# Patient Record
Sex: Female | Born: 1975 | Race: White | Hispanic: No | Marital: Married | Smoking: Never smoker
Health system: Southern US, Community
[De-identification: ages and names within clinical notes are randomized; demographics above are authoritative.]

## PROBLEM LIST (undated history)

## (undated) DIAGNOSIS — Q66219 Congenital metatarsus primus varus, unspecified foot: Secondary | ICD-10-CM

## (undated) DIAGNOSIS — M1990 Unspecified osteoarthritis, unspecified site: Secondary | ICD-10-CM

## (undated) DIAGNOSIS — L409 Psoriasis, unspecified: Secondary | ICD-10-CM

## (undated) DIAGNOSIS — M2040 Other hammer toe(s) (acquired), unspecified foot: Secondary | ICD-10-CM

## (undated) DIAGNOSIS — M2010 Hallux valgus (acquired), unspecified foot: Secondary | ICD-10-CM

## (undated) DIAGNOSIS — N189 Chronic kidney disease, unspecified: Secondary | ICD-10-CM

## (undated) DIAGNOSIS — R7401 Elevation of levels of liver transaminase levels: Secondary | ICD-10-CM

## (undated) DIAGNOSIS — E063 Autoimmune thyroiditis: Secondary | ICD-10-CM

## (undated) DIAGNOSIS — R740 Nonspecific elevation of levels of transaminase and lactic acid dehydrogenase [LDH]: Secondary | ICD-10-CM

## (undated) DIAGNOSIS — E039 Hypothyroidism, unspecified: Secondary | ICD-10-CM

## (undated) DIAGNOSIS — N200 Calculus of kidney: Secondary | ICD-10-CM

## (undated) DIAGNOSIS — F32A Depression, unspecified: Secondary | ICD-10-CM

## (undated) DIAGNOSIS — F329 Major depressive disorder, single episode, unspecified: Secondary | ICD-10-CM

## (undated) HISTORY — DX: Elevation of levels of liver transaminase levels: R74.01

## (undated) HISTORY — DX: Psoriasis, unspecified: L40.9

## (undated) HISTORY — DX: Autoimmune thyroiditis: E06.3

## (undated) HISTORY — DX: Chronic kidney disease, unspecified: N18.9

## (undated) HISTORY — DX: Calculus of kidney: N20.0

## (undated) HISTORY — DX: Nonspecific elevation of levels of transaminase and lactic acid dehydrogenase (ldh): R74.0

## (undated) HISTORY — PX: FOOT SURGERY: SHX648

## (undated) HISTORY — DX: Unspecified osteoarthritis, unspecified site: M19.90

---

## 2011-06-01 ENCOUNTER — Other Ambulatory Visit (HOSPITAL_BASED_OUTPATIENT_CLINIC_OR_DEPARTMENT_OTHER): Payer: Self-pay | Admitting: Family Medicine

## 2011-06-01 DIAGNOSIS — Z1231 Encounter for screening mammogram for malignant neoplasm of breast: Secondary | ICD-10-CM

## 2011-06-02 ENCOUNTER — Ambulatory Visit (HOSPITAL_BASED_OUTPATIENT_CLINIC_OR_DEPARTMENT_OTHER)
Admission: RE | Admit: 2011-06-02 | Discharge: 2011-06-02 | Disposition: A | Discharge status: Home / Self Care | Payer: 59 | Source: Ambulatory Visit | Attending: Family Medicine | Admitting: Family Medicine

## 2011-06-02 ENCOUNTER — Ambulatory Visit: Payer: Self-pay | Admitting: Internal Medicine

## 2011-06-02 DIAGNOSIS — Z1231 Encounter for screening mammogram for malignant neoplasm of breast: Secondary | ICD-10-CM

## 2011-06-29 DIAGNOSIS — M2040 Other hammer toe(s) (acquired), unspecified foot: Secondary | ICD-10-CM

## 2011-06-29 DIAGNOSIS — Q66219 Congenital metatarsus primus varus, unspecified foot: Secondary | ICD-10-CM

## 2011-06-29 DIAGNOSIS — M2010 Hallux valgus (acquired), unspecified foot: Secondary | ICD-10-CM

## 2011-06-29 HISTORY — PX: BUNIONECTOMY: SHX129

## 2011-06-29 HISTORY — DX: Congenital metatarsus primus varus, unspecified foot: Q66.219

## 2011-06-29 HISTORY — PX: BREAST ENHANCEMENT SURGERY: SHX7

## 2011-06-29 HISTORY — DX: Other hammer toe(s) (acquired), unspecified foot: M20.40

## 2011-06-29 HISTORY — DX: Hallux valgus (acquired), unspecified foot: M20.10

## 2011-07-02 ENCOUNTER — Ambulatory Visit: Payer: Self-pay | Admitting: Family Medicine

## 2011-07-06 ENCOUNTER — Ambulatory Visit (INDEPENDENT_AMBULATORY_CARE_PROVIDER_SITE_OTHER): Payer: 59

## 2011-07-06 DIAGNOSIS — M79609 Pain in unspecified limb: Secondary | ICD-10-CM

## 2011-07-08 ENCOUNTER — Encounter (HOSPITAL_BASED_OUTPATIENT_CLINIC_OR_DEPARTMENT_OTHER): Payer: Self-pay | Admitting: *Deleted

## 2011-07-08 NOTE — Pre-Procedure Instructions (Signed)
H & P req. from Dr. Beverlee Nims office

## 2011-07-14 ENCOUNTER — Encounter (HOSPITAL_BASED_OUTPATIENT_CLINIC_OR_DEPARTMENT_OTHER): Admission: RE | Payer: Self-pay | Source: Ambulatory Visit

## 2011-07-14 ENCOUNTER — Ambulatory Visit (HOSPITAL_BASED_OUTPATIENT_CLINIC_OR_DEPARTMENT_OTHER): Admit: 2011-07-14 | Payer: Self-pay | Admitting: Podiatry

## 2011-07-14 ENCOUNTER — Encounter (HOSPITAL_BASED_OUTPATIENT_CLINIC_OR_DEPARTMENT_OTHER): Payer: Self-pay

## 2011-07-14 ENCOUNTER — Ambulatory Visit (HOSPITAL_BASED_OUTPATIENT_CLINIC_OR_DEPARTMENT_OTHER): Admission: RE | Admit: 2011-07-14 | Payer: 59 | Source: Ambulatory Visit | Admitting: Podiatry

## 2011-07-14 HISTORY — DX: Hypothyroidism, unspecified: E03.9

## 2011-07-14 HISTORY — DX: Depression, unspecified: F32.A

## 2011-07-14 HISTORY — DX: Major depressive disorder, single episode, unspecified: F32.9

## 2011-07-14 HISTORY — DX: Congenital metatarsus primus varus, unspecified foot: Q66.219

## 2011-07-14 HISTORY — DX: Hallux valgus (acquired), unspecified foot: M20.10

## 2011-07-14 HISTORY — DX: Other hammer toe(s) (acquired), unspecified foot: M20.40

## 2011-07-14 SURGERY — BUNIONECTOMY
Anesthesia: Monitor Anesthesia Care | Laterality: Right

## 2011-08-10 ENCOUNTER — Ambulatory Visit (INDEPENDENT_AMBULATORY_CARE_PROVIDER_SITE_OTHER): Payer: 59 | Admitting: Psychology

## 2011-08-10 DIAGNOSIS — F341 Dysthymic disorder: Secondary | ICD-10-CM

## 2011-08-10 DIAGNOSIS — Z630 Problems in relationship with spouse or partner: Secondary | ICD-10-CM

## 2011-08-10 NOTE — Progress Notes (Signed)
Patient ID: Lacey Murphy, female   DOB: [birth year], 35 y.o.   MRN: [id number]   Lacey Murphy made this appointment to explore the possibility of marital counseling with her and her husband.  She revealed that some secrets have come out since Christmas that have created a crisis that they are working through together and feel they may need some help.    They have already seen one counselor and liked her very much, but she is out-of-network, so she is exploring other options.  I reviewed her self-report, talked with her about her current treatment for dysthymia and her understanding of what she and her husband want to be different.  She reports that the stress in their lives is much less than in the past, the children are both in school, and she wants to strengthen the good things they have now.  She says that actually things are much improved than in Dec. And she hopes to maintain the gains they have made.  We did not go into lengthy history, but instead did some problem-solving about how she might see one of our marriage therapists at the [place] office, or see the person they have already seen and apply for reimbursement from insurance.  Lacey Murphy chose to not make another appointment here, but will follow one of the other suggestions on her own.      Shonna Chock, APRN

## 2011-08-18 ENCOUNTER — Encounter: Payer: 59 | Admitting: Internal Medicine

## 2011-09-02 ENCOUNTER — Ambulatory Visit (INDEPENDENT_AMBULATORY_CARE_PROVIDER_SITE_OTHER): Payer: 59 | Admitting: Internal Medicine

## 2011-09-02 ENCOUNTER — Encounter: Payer: Self-pay | Admitting: Internal Medicine

## 2011-09-02 VITALS — BP 110/60 | HR 83 | Temp 99.1°F | Ht 61.25 in | Wt 115.0 lb

## 2011-09-02 DIAGNOSIS — N760 Acute vaginitis: Secondary | ICD-10-CM

## 2011-09-02 DIAGNOSIS — Z803 Family history of malignant neoplasm of breast: Secondary | ICD-10-CM

## 2011-09-02 DIAGNOSIS — Z1272 Encounter for screening for malignant neoplasm of vagina: Secondary | ICD-10-CM

## 2011-09-02 DIAGNOSIS — E039 Hypothyroidism, unspecified: Secondary | ICD-10-CM

## 2011-09-02 DIAGNOSIS — G43909 Migraine, unspecified, not intractable, without status migrainosus: Secondary | ICD-10-CM | POA: Insufficient documentation

## 2011-09-02 DIAGNOSIS — F329 Major depressive disorder, single episode, unspecified: Secondary | ICD-10-CM | POA: Insufficient documentation

## 2011-09-02 DIAGNOSIS — Z01419 Encounter for gynecological examination (general) (routine) without abnormal findings: Secondary | ICD-10-CM

## 2011-09-02 DIAGNOSIS — Z113 Encounter for screening for infections with a predominantly sexual mode of transmission: Secondary | ICD-10-CM

## 2011-09-02 DIAGNOSIS — Z800 Family history of malignant neoplasm of digestive organs: Secondary | ICD-10-CM | POA: Insufficient documentation

## 2011-09-02 LAB — CBC WITH DIFFERENTIAL/PLATELET
Basophils Absolute: 0 10*3/uL (ref 0.0–0.1)
Eosinophils Relative: 1 % (ref 0–5)
Lymphocytes Relative: 18 % (ref 12–46)
Lymphs Abs: 1.5 10*3/uL (ref 0.7–4.0)
Neutro Abs: 6.4 10*3/uL (ref 1.7–7.7)
Neutrophils Relative %: 77 % (ref 43–77)
Platelets: 267 10*3/uL (ref 150–400)
RBC: 4.74 MIL/uL (ref 3.87–5.11)
RDW: 12.5 % (ref 11.5–15.5)
WBC: 8.4 10*3/uL (ref 4.0–10.5)

## 2011-09-02 LAB — LIPID PANEL
Cholesterol: 183 mg/dL (ref 0–200)
Total CHOL/HDL Ratio: 2.8 Ratio
VLDL: 28 mg/dL (ref 0–40)

## 2011-09-02 LAB — T3, FREE: T3, Free: 2.4 pg/mL (ref 2.3–4.2)

## 2011-09-02 LAB — COMPREHENSIVE METABOLIC PANEL
ALT: 37 U/L — ABNORMAL HIGH (ref 0–35)
AST: 31 U/L (ref 0–37)
CO2: 26 mEq/L (ref 19–32)
Calcium: 9.3 mg/dL (ref 8.4–10.5)
Chloride: 102 mEq/L (ref 96–112)
Creat: 0.97 mg/dL (ref 0.50–1.10)
Sodium: 140 mEq/L (ref 135–145)
Total Protein: 6.6 g/dL (ref 6.0–8.3)

## 2011-09-02 LAB — POCT URINALYSIS DIPSTICK
Blood, UA: NEGATIVE
Glucose, UA: NEGATIVE
Ketones, UA: NEGATIVE
Spec Grav, UA: 1.015
Urobilinogen, UA: NEGATIVE

## 2011-09-02 LAB — TSH: TSH: 2.848 u[IU]/mL (ref 0.350–4.500)

## 2011-09-02 LAB — T4, FREE: Free T4: 1.25 ng/dL (ref 0.80–1.80)

## 2011-09-02 MED ORDER — FLUCONAZOLE 150 MG PO TABS: 150.0000 mg | ORAL_TABLET | Freq: Once | ORAL | Status: AC

## 2011-09-02 MED ORDER — LEVOTHYROXINE SODIUM 75 MCG PO TABS: 75.0000 ug | ORAL_TABLET | Freq: Every day | ORAL | Status: DC

## 2011-09-02 NOTE — Progress Notes (Signed)
Subjective:    Patient ID: Lacey Murphy, female    DOB: 09-06-1975, 36 y.o.   MRN: [id number]  HPI Itzelle is here for a new pt visit and CPE.  Former primary Dr. Gertie Gowda.  PMH of migraine headaches,  Depression and mild dysthymia per her report "for years", hypothyroidism and recent R bunionectomy 1/13 and breast augmentation 1/13.    No Known Allergies Past Medical History  Diagnosis Date  . 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 Hammertoe 06/2011    right 4th  . Migraine   . Depression   . Hypothyroidism    Past Surgical History  Procedure Date  . Foot surgery     bilat.  . Breast enhancement surgery 1/13  . Bunionectomy 1/13    right   History   Social History  . Marital Status: Married    Spouse Name: N/A    Number of Children: N/A  . Years of Education: N/A   Occupational History  . Not on file.   Social History Main Topics  . Smoking status: Never Smoker   . Smokeless tobacco: Never Used  . Alcohol Use: 4.2 oz/week    7 Glasses of wine per week     socially  . Drug Use: No  . Sexually Active: Yes    Birth Control/ Protection: Surgical   Other Topics Concern  . Not on file   Social History Narrative  . No narrative on file   Family History  Problem Relation Age of Onset  . Breast cancer Mother   . Hyperlipidemia Mother   . Colon polyps Mother   . Heart failure Father   . Prostate cancer Father   . Depression Sister   . Depression Brother   . Colon cancer Maternal Grandmother   . Colon polyps Maternal Grandmother   . Hypertension Paternal Grandmother   . Heart failure Paternal Grandfather    Patient Active Problem List  Diagnoses  . Depression  . Migraine  . Hypothyroidism  . Hammertoe   Current Outpatient Prescriptions on File Prior to Visit  Medication Sig Dispense Refill  . folic acid (FOLVITE) 1 MG tablet Take 15 mg by mouth daily.           Review of Systems     Objective:   Physical Exam Physical Exam  Vital signs and nursing note reviewed  Constitutional: She is oriented to person, place, and time. She appears well-developed and well-nourished. She is cooperative.  HENT:  Head: Normocephalic and atraumatic.  Right Ear: Tympanic membrane normal.  Left Ear: Tympanic membrane normal.  Nose: Nose normal.  Mouth/Throat: Oropharynx is clear and moist and mucous membranes are normal. No oropharyngeal exudate or posterior oropharyngeal erythema.  Eyes: Conjunctivae and EOM are normal. Pupils are equal, round, and reactive to light.  Neck: Neck supple. No JVD present. Carotid bruit is not present. No mass and no thyromegaly present.  Cardiovascular: Regular rhythm, normal heart sounds, intact distal pulses and normal pulses.  Exam reveals no gallop and no friction rub.   No murmur heard. Pulses:      Dorsalis pedis pulses are 2+ on the right side, and 2+ on the left side.  Pulmonary/Chest: Breath sounds normal. She has no wheezes. She has no rhonchi. She has no rales. Right breast exhibits no mass, no nipple discharge and no skin change. Left breast exhibits no mass, no nipple discharge and no skin  change.  Abdominal: Soft. Bowel sounds are normal. She exhibits no distension and no mass. There is no hepatosplenomegaly. There is no tenderness. There is no CVA tenderness.  Genitourinary: Rectum normal, vagina normal and uterus normal. Rectal exam shows no mass. Guaiac negative stool. No labial fusion. There is no lesion on the right labia. There is no lesion on the left labia. Cervix exhibits no motion tenderness. Right adnexum displays no mass, no tenderness and no fullness. Left adnexum displays no mass, no tenderness and no fullness. No erythema around the vagina.  Musculoskeletal:       No active synovitis to any joint.    Lymphadenopathy:       Right cervical: No superficial cervical adenopathy present.      Left cervical: No superficial cervical adenopathy present.         Right axillary: No pectoral and no lateral adenopathy present.       Left axillary: No pectoral and no lateral adenopathy present.      Right: No inguinal adenopathy present.       Left: No inguinal adenopathy present.  Neurological: She is alert and oriented to person, place, and time. She has normal strength and normal reflexes. No cranial nerve deficit or sensory deficit. She displays a negative Romberg sign. Coordination and gait normal.  Skin: Skin is warm and dry. No abrasion, no bruising, no ecchymosis and no rash noted. No cyanosis. Nails show no clubbing.  Psychiatric: She has a normal mood and affect. Her speech is normal and behavior is normal.          Assessment & Plan:  1)  Elevated HGB on fingerstick. Will recheck today 2)  Health Maintenance:  See scanned sheet UTD on TDAP  2)  Vaginitis  OK for 5 day coruse of diflucan 3)  FH of breast CA nd colon CA  Will get BRCA today.  Pt is to check with mother as to when [facility] diagnosed with colon CA 4)  Depression/Dysthymia  I asked pt to get records from   Psychiatry office 5)  Hypothyroidism:  Check levels today  Will need old records        Assessment & Plan:

## 2011-09-02 NOTE — Patient Instructions (Addendum)
Labs will be mailed to you  See me as needed 

## 2011-09-03 LAB — WET PREP, GENITAL
Clue Cells Wet Prep HPF POC: NONE SEEN
Trich, Wet Prep: NONE SEEN
WBC, Wet Prep HPF POC: NONE SEEN
Yeast Wet Prep HPF POC: NONE SEEN

## 2011-09-06 ENCOUNTER — Encounter: Payer: Self-pay | Admitting: Emergency Medicine

## 2011-09-13 ENCOUNTER — Encounter: Payer: Self-pay | Admitting: Emergency Medicine

## 2011-09-15 ENCOUNTER — Encounter: Payer: Self-pay | Admitting: Internal Medicine

## 2011-09-16 ENCOUNTER — Telehealth: Payer: Self-pay | Admitting: Internal Medicine

## 2011-09-16 NOTE — Telephone Encounter (Signed)
Spoke with Lacey Murphy, she is aware BRACA was negative.    She also has a question regarding her regular lab work.  She states that she noted her TSH was 2.8.  It was 2.0 in October per her report.  She reports that she generally feels better when her TSH is in the 1.8-2 range and wondered if DDS would be willing to increase her Synthroid to ?  Aware I will speak with DDS and call back with her recommendations.

## 2011-09-16 NOTE — Telephone Encounter (Signed)
Pt is calling to see if the results of her test for history of breast cancer came to you. She states she spoke with the company and they stated she should contact the doctor's office. Please call pt at [phone]. Thanks

## 2011-09-19 NOTE — Telephone Encounter (Signed)
Tell Lacey Murphy to change dose to 1 tab M-F and 1 and 1/2 tab of the 75 mcg on Sat and Sun and this should get her TSH in normal range  See needs to come to office in 8-10 weeks for a TSH  Blood draw.    Ok to refill the 75 mcg dose for 90 days if she needs it

## 2011-09-20 NOTE — Telephone Encounter (Signed)
Attempted to contact Suzette.  No answer and her voicemail has not been set up.  Will try back later

## 2011-09-20 NOTE — Telephone Encounter (Signed)
Attempted to contact Lacey Murphy, no answer, unable to leave voicemail

## 2011-09-21 ENCOUNTER — Telehealth: Payer: Self-pay | Admitting: Internal Medicine

## 2011-09-21 NOTE — Telephone Encounter (Signed)
Left detailed message on pt's voicemail on cell phone with instructions to increase to 1 tab of levothyroxine M-F and 1.5 tabs on Sat and Sun.  Can send refill of medication to reflect dosing change if needed to pharmacy.

## 2011-09-21 NOTE — Telephone Encounter (Signed)
See other telephone note for details.

## 2011-09-21 NOTE — Telephone Encounter (Signed)
Pt was returning phone call;/ AD

## 2011-09-21 NOTE — Telephone Encounter (Signed)
Spoke with Lacey Murphy, she is agreeable to dose change.  Will call for refill when needed.  Reminder sent to [facility] for labs

## 2011-10-05 ENCOUNTER — Other Ambulatory Visit: Payer: Self-pay | Admitting: Emergency Medicine

## 2011-10-05 NOTE — Telephone Encounter (Signed)
[place] called, requesting refill of Wellbutrin XL 90 day supply to [facility].  Would also like brand name if possible.

## 2011-10-06 MED ORDER — BUPROPION HCL ER (XL) 150 MG PO TB24: 450.0000 mg | ORAL_TABLET | Freq: Every day | ORAL | Status: DC

## 2011-10-10 ENCOUNTER — Encounter: Payer: Self-pay | Admitting: Internal Medicine

## 2011-11-10 ENCOUNTER — Other Ambulatory Visit: Payer: Self-pay | Admitting: *Deleted

## 2011-11-10 DIAGNOSIS — F329 Major depressive disorder, single episode, unspecified: Secondary | ICD-10-CM

## 2011-11-10 DIAGNOSIS — E039 Hypothyroidism, unspecified: Secondary | ICD-10-CM

## 2011-11-10 MED ORDER — BUPROPION HCL ER (XL) 150 MG PO TB24: 450.0000 mg | ORAL_TABLET | Freq: Every day | ORAL | Status: DC

## 2011-11-10 NOTE — Telephone Encounter (Signed)
Pt called requesting that her Wellbutrin be for a 90 day supply.  Per Dr Constance Goltz go ahead and order 270tab as she takes 3 daily and 3 RF.  She is to come for a TSH on Friday 11/12/11.  Lab slip to be taken to [facility]

## 2011-11-23 ENCOUNTER — Telehealth: Payer: Self-pay | Admitting: Internal Medicine

## 2011-11-23 NOTE — Telephone Encounter (Signed)
Left message on pts cell and home phone to return call regarding thyroid blood work

## 2011-11-25 ENCOUNTER — Encounter: Payer: Self-pay | Admitting: *Deleted

## 2011-11-29 ENCOUNTER — Encounter: Payer: Self-pay | Admitting: *Deleted

## 2011-11-29 ENCOUNTER — Telehealth: Payer: Self-pay | Admitting: Internal Medicine

## 2011-11-29 MED ORDER — LEVOTHYROXINE SODIUM 75 MCG PO TABS: ORAL_TABLET | ORAL | Status: DC

## 2011-11-29 NOTE — Telephone Encounter (Signed)
Called Marisha and she is returning Dr. Lonell Face call- transferred call to Dr. Constance Goltz

## 2011-11-29 NOTE — Telephone Encounter (Signed)
Spoke with pt and given TSH results OK to take 1 and 1/2 thyroid med on M,W,Fri.  Pt voices understanding

## 2011-11-29 NOTE — Telephone Encounter (Signed)
Encounter sent back to Dr. Constance Goltz

## 2011-11-29 NOTE — Telephone Encounter (Signed)
Lacey Murphy,  Ok to mail last TSH to pt  Thanks

## 2011-11-29 NOTE — Telephone Encounter (Signed)
Lab mailed to patient

## 2011-11-29 NOTE — Telephone Encounter (Signed)
Pt lvm stating she have some questions about prescription change. Also she needs a prescription for the medication change. She states Dr. Constance Goltz called her 1 or 2 weeks ago.... Best number to contact [phone]

## 2011-12-14 ENCOUNTER — Emergency Department (HOSPITAL_BASED_OUTPATIENT_CLINIC_OR_DEPARTMENT_OTHER)
Admission: EM | Admit: 2011-12-14 | Discharge: 2011-12-15 | Disposition: A | Discharge status: Home / Self Care | Payer: 59 | Attending: Emergency Medicine | Admitting: Emergency Medicine

## 2011-12-14 ENCOUNTER — Encounter (HOSPITAL_BASED_OUTPATIENT_CLINIC_OR_DEPARTMENT_OTHER): Payer: Self-pay | Admitting: *Deleted

## 2011-12-14 DIAGNOSIS — F3289 Other specified depressive episodes: Secondary | ICD-10-CM | POA: Insufficient documentation

## 2011-12-14 DIAGNOSIS — E039 Hypothyroidism, unspecified: Secondary | ICD-10-CM | POA: Insufficient documentation

## 2011-12-14 DIAGNOSIS — K2920 Alcoholic gastritis without bleeding: Secondary | ICD-10-CM | POA: Insufficient documentation

## 2011-12-14 DIAGNOSIS — F329 Major depressive disorder, single episode, unspecified: Secondary | ICD-10-CM | POA: Insufficient documentation

## 2011-12-14 DIAGNOSIS — Z79899 Other long term (current) drug therapy: Secondary | ICD-10-CM | POA: Insufficient documentation

## 2011-12-14 LAB — DIFFERENTIAL
Basophils Relative: 0 % (ref 0–1)
Eosinophils Absolute: 0.1 10*3/uL (ref 0.0–0.7)
Eosinophils Relative: 1 % (ref 0–5)
Monocytes Absolute: 0.4 10*3/uL (ref 0.1–1.0)
Monocytes Relative: 6 % (ref 3–12)

## 2011-12-14 LAB — LIPASE, BLOOD: Lipase: 26 U/L (ref 11–59)

## 2011-12-14 LAB — COMPREHENSIVE METABOLIC PANEL
Albumin: 4.3 g/dL (ref 3.5–5.2)
BUN: 15 mg/dL (ref 6–23)
Calcium: 9.3 mg/dL (ref 8.4–10.5)
Creatinine, Ser: 1 mg/dL (ref 0.50–1.10)
Total Bilirubin: 0.5 mg/dL (ref 0.3–1.2)
Total Protein: 6.9 g/dL (ref 6.0–8.3)

## 2011-12-14 LAB — CBC
HCT: 42 % (ref 36.0–46.0)
Hemoglobin: 14.3 g/dL (ref 12.0–15.0)
MCH: 29.9 pg (ref 26.0–34.0)
MCHC: 34 g/dL (ref 30.0–36.0)
RDW: 12.8 % (ref 11.5–15.5)

## 2011-12-14 LAB — URINALYSIS, ROUTINE W REFLEX MICROSCOPIC
Glucose, UA: NEGATIVE mg/dL
Hgb urine dipstick: NEGATIVE
Ketones, ur: 40 mg/dL — AB
Protein, ur: NEGATIVE mg/dL
Urobilinogen, UA: 0.2 mg/dL (ref 0.0–1.0)

## 2011-12-14 MED ORDER — FAMOTIDINE IN NACL 20-0.9 MG/50ML-% IV SOLN
20.0000 mg | Freq: Once | INTRAVENOUS | Status: AC
  Administered 2011-12-14: 20 mg via INTRAVENOUS
  Filled 2011-12-14: qty 50

## 2011-12-14 MED ORDER — ONDANSETRON HCL 4 MG/2ML IJ SOLN
4.0000 mg | Freq: Once | INTRAMUSCULAR | Status: AC
  Administered 2011-12-14: 4 mg via INTRAVENOUS
  Filled 2011-12-14: qty 2

## 2011-12-14 MED ORDER — MORPHINE SULFATE 4 MG/ML IJ SOLN
4.0000 mg | Freq: Once | INTRAMUSCULAR | Status: AC
  Administered 2011-12-14: 4 mg via INTRAVENOUS
  Filled 2011-12-14: qty 1

## 2011-12-14 MED ORDER — SODIUM CHLORIDE 0.9 % IV BOLUS (SEPSIS)
1000.0000 mL | Freq: Once | INTRAVENOUS | Status: AC
  Administered 2011-12-14: 1000 mL via INTRAVENOUS

## 2011-12-14 NOTE — ED Notes (Signed)
Pt c/o left lower abd pain  W/ n/v x 3 days

## 2011-12-14 NOTE — ED Provider Notes (Addendum)
History     CSN: [id number]  Arrival date & time 12/14/11  2104   First MD Initiated Contact with Patient 12/14/11 2220      Chief Complaint  Patient presents with  . Abdominal Pain    (Consider location/radiation/quality/duration/timing/severity/associated sxs/prior treatment) Patient is a 36 y.o. female presenting with abdominal pain. The history is provided by the patient.  Abdominal Pain The primary symptoms of the illness include abdominal pain, nausea and vomiting. The primary symptoms of the illness do not include fever, diarrhea, dysuria, vaginal discharge or vaginal bleeding. Episode onset: 3 days ago. The onset of the illness was gradual. The problem has been gradually worsening.  The pain came on gradually. The abdominal pain has been gradually worsening since its onset. The abdominal pain is located in the epigastric region and LUQ. The abdominal pain does not radiate. The severity of the abdominal pain is 9/10. The abdominal pain is relieved by nothing. The abdominal pain is exacerbated by eating and vomiting.  The illness is associated with alcohol use. The patient states that she believes she is currently not pregnant. Additional symptoms associated with the illness include anorexia. Symptoms associated with the illness do not include chills, diaphoresis, urgency, frequency or back pain. Associated medical issues comments: none.    Past Medical History  Diagnosis Date  . 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 Hammertoe 06/2011    right 4th  . Migraine   . Depression   . Hypothyroidism     Past Surgical History  Procedure Date  . Foot surgery     bilat.  . Breast enhancement surgery 1/13  . Bunionectomy 1/13    right    Family History  Problem Relation Age of Onset  . Breast cancer Mother   . Hyperlipidemia Mother   . Colon polyps Mother   . Heart failure Father   . Prostate cancer Father   . Depression  Sister   . Depression Brother   . Colon cancer Maternal Grandmother   . Colon polyps Maternal Grandmother   . Hypertension Paternal Grandmother   . Heart failure Paternal Grandfather     History  Substance Use Topics  . Smoking status: Never Smoker   . Smokeless tobacco: Never Used  . Alcohol Use: 4.2 oz/week    7 Glasses of wine per week     socially    OB History    Grav Para Term Preterm Abortions TAB SAB Ect Mult Living   3 2   1            Review of Systems  Constitutional: Negative for fever, chills and diaphoresis.  Gastrointestinal: Positive for nausea, vomiting, abdominal pain and anorexia. Negative for diarrhea.  Genitourinary: Negative for dysuria, urgency, frequency, vaginal bleeding and vaginal discharge.  Musculoskeletal: Negative for back pain.  All other systems reviewed and are negative.    Allergies  Review of patient's allergies indicates no known allergies.  Home Medications   Current Outpatient Rx  Name Route Sig Dispense Refill  . BUPROPION HCL ER (XL) 150 MG PO TB24 Oral Take 3 tablets (450 mg total) by mouth daily. 270 tablet 3  . CALCIUM CARBONATE ANTACID 500 MG PO CHEW Oral Chew 2 tablets by mouth daily. Patient used this medication for her stomach pain.    Marland Kitchen LEVOTHYROXINE SODIUM 75 MCG PO TABS  Take 1 and 1/2 tab M, W, Fri.  Take one  tablet Tues, Thurs, Sat, Sun 90 tablet 1  . PROMETHAZINE HCL 25 MG PO TABS Oral Take 25 mg by mouth every 6 (six) hours as needed. Patient used this medication for nausea.      BP 107/73  Temp 99.5 F (37.5 C) (Oral)  Resp 16  Ht 5\' 2"  (1.575 m)  Wt 112 lb (50.803 kg)  BMI 20.49 kg/m2  SpO2 98%  LMP 11/28/2011  Physical Exam  Nursing note and vitals reviewed. Constitutional: She is oriented to person, place, and time. She appears well-developed and well-nourished. No distress.  HENT:  Head: Normocephalic and atraumatic.  Mouth/Throat: Oropharynx is clear and moist.  Eyes: Conjunctivae and EOM are  normal. Pupils are equal, round, and reactive to light.  Neck: Normal range of motion. Neck supple.  Cardiovascular: Normal rate, regular rhythm and intact distal pulses.   No murmur heard. Pulmonary/Chest: Effort normal and breath sounds normal. No respiratory distress. She has no wheezes. She has no rales.  Abdominal: Soft. Normal appearance. She exhibits no distension. There is tenderness in the epigastric area and left upper quadrant. There is no rebound and no guarding.  Musculoskeletal: Normal range of motion. She exhibits no edema and no tenderness.  Neurological: She is alert and oriented to person, place, and time.  Skin: Skin is warm and dry. No rash noted. No erythema.  Psychiatric: She has a normal mood and affect. Her behavior is normal.    ED Course  Procedures (including critical care time)  Labs Reviewed  URINALYSIS, ROUTINE W REFLEX MICROSCOPIC - Abnormal; Notable for the following:    Ketones, ur 40 (*)     All other components within normal limits  COMPREHENSIVE METABOLIC PANEL - Abnormal; Notable for the following:    ALT 39 (*)     GFR calc non Af Amer 71 (*)     GFR calc Af Amer 82 (*)     All other components within normal limits  PREGNANCY, URINE  CBC  DIFFERENTIAL  LIPASE, BLOOD   No results found.   1. Alcoholic gastritis       MDM   Patient with abdominal pain that started 2 days ago after a night of heavy alcohol consumption. She has had nausea and vomiting without other symptoms. The pain is worse with eating. She has epigastric tenderness on exam but no lower quadrant tenderness and no tenderness over her gallbladder. Alcoholic gastritis versus pancreatitis. There are no symptoms to suggest diverticulitis, cholecystitis or appendicitis. Patient given IV fluids pain and nausea control. CBC, CMP, lipase are all within normal limits other than a mildly elevated ALT. Her UA is positive for ketones but otherwise within normal limits. After IV fluids  and pain and nausea control the patient is feeling better. Will start her on a H2 blocker.  12:02 AM Pt tolerating po's.      Gwyneth Sprout, MD 12/15/11 7846  Gwyneth Sprout, MD 12/15/11 9629

## 2011-12-15 MED ORDER — RANITIDINE HCL 150 MG PO CAPS: 150.0000 mg | ORAL_CAPSULE | Freq: Every day | ORAL | Status: DC

## 2011-12-15 MED ORDER — ONDANSETRON HCL 4 MG PO TABS: 4.0000 mg | ORAL_TABLET | Freq: Four times a day (QID) | ORAL | Status: AC

## 2011-12-15 NOTE — ED Notes (Signed)
Pts husband went home to take care of the kids and pt has no ride home now.  She has her car here but is not able to drive at this time due to receiving Morphine.  Pt will remain in dept for observation until 3am, at which time discharge will be completed.  Pt is agreeable with this plan.

## 2011-12-16 ENCOUNTER — Ambulatory Visit (HOSPITAL_BASED_OUTPATIENT_CLINIC_OR_DEPARTMENT_OTHER)
Admission: RE | Admit: 2011-12-16 | Discharge: 2011-12-16 | Disposition: A | Discharge status: Home / Self Care | Payer: 59 | Source: Ambulatory Visit | Attending: Internal Medicine | Admitting: Internal Medicine

## 2011-12-16 ENCOUNTER — Ambulatory Visit (INDEPENDENT_AMBULATORY_CARE_PROVIDER_SITE_OTHER): Payer: 59 | Admitting: Internal Medicine

## 2011-12-16 ENCOUNTER — Telehealth: Payer: Self-pay | Admitting: Internal Medicine

## 2011-12-16 ENCOUNTER — Encounter: Payer: Self-pay | Admitting: Internal Medicine

## 2011-12-16 VITALS — BP 98/66 | HR 84 | Temp 98.1°F | Resp 16 | Ht 62.0 in | Wt 116.0 lb

## 2011-12-16 DIAGNOSIS — R1110 Vomiting, unspecified: Secondary | ICD-10-CM

## 2011-12-16 DIAGNOSIS — N200 Calculus of kidney: Secondary | ICD-10-CM | POA: Insufficient documentation

## 2011-12-16 DIAGNOSIS — R109 Unspecified abdominal pain: Secondary | ICD-10-CM

## 2011-12-16 LAB — CBC WITH DIFFERENTIAL/PLATELET
Basophils Absolute: 0 10*3/uL (ref 0.0–0.1)
HCT: 41.4 % (ref 36.0–46.0)
Lymphocytes Relative: 20 % (ref 12–46)
Monocytes Absolute: 0.4 10*3/uL (ref 0.1–1.0)
Neutro Abs: 4.7 10*3/uL (ref 1.7–7.7)
RBC: 4.65 MIL/uL (ref 3.87–5.11)
RDW: 13 % (ref 11.5–15.5)
WBC: 6.6 10*3/uL (ref 4.0–10.5)

## 2011-12-16 MED ORDER — IOHEXOL 300 MG/ML  SOLN
100.0000 mL | Freq: Once | INTRAMUSCULAR | Status: AC | PRN
  Administered 2011-12-16: 100 mL via INTRAVENOUS

## 2011-12-16 NOTE — Progress Notes (Signed)
  Subjective:    Patient ID: Lacey Murphy, female    DOB: Nov 07, 1975, 36 y.o.   MRN: [id number]  HPI Lacey Murphy is here for ER follow up.  Seen at [facility] ED and felt to have alcoholic gastritis.  WBC, Lipase, u/a and pregnancy all negative.    She reports she was drinking heavily on SAt night and drinks approx 3-4 times weekly on a consistant basis.  She woke up Sunday with epigastric pain and began vomiting Sunday evening.  Severe nausea persisted and epigastric pain worsened prompting ER visit.  She was not able to eat or drink for a few days and received IVf's and Zofran in ER  Now she still feels persistant nausea, can keep liquids down but no solids.  She feels constipated and is not passing much flatus.  LBM Tuesday, no diarrhea,  No vaginal discharge, LMP  6/2    Review of Systems    see HPI Objective:   Physical Exam Physical Exam  Nursing note and vitals reviewed.  Constitutional: She is oriented to person, place, and time. She appears well-developed and well-nourished.  HENT:  Head: Normocephalic and atraumatic.  Cardiovascular: Normal rate and regular rhythm. Exam reveals no gallop and no friction rub.  No murmur heard.  Pulmonary/Chest: Breath sounds normal. She has no wheezes. She has no rales.  ABd:  Mild tenderness to deep palpation throughout No rebound  BS decreased  No peritoneal signs.  No HSM Neurological: She is alert and oriented to person, place, and time.  Skin: Skin is warm and dry.  Psychiatric: She has a normal mood and affect. Her behavior is normal.         Assessment & Plan:  Abd pain persistant nausea:  Will need to have CT to check for ileus or other reason for persistant vomiting. Pt has Zofran at home.  Advised to increase fluids as tolerated.  OK for bland diet  Possible ETOH gastritis  Advised to stop alcohol temporarily

## 2011-12-16 NOTE — Telephone Encounter (Signed)
Spoke with pt.  And reported results of CT.  No acute findings, she does have a stone in L upper pole of kidney.  Advised to get prilosec or prevacid OTC and take daily.  She was able to eat crackers today with no nausea.  Advised to increase fluids and bland soft foods for now.  She states she is passing flatus

## 2011-12-16 NOTE — Patient Instructions (Addendum)
To have CT today 

## 2011-12-17 ENCOUNTER — Telehealth: Payer: Self-pay | Admitting: Internal Medicine

## 2011-12-17 NOTE — Telephone Encounter (Signed)
Spoke with pt and informed of lab results.  She is feeling better tolerating crackers and bland diet.  Chocolate and caffiene bothers her.   She had normal stool this am, no frank blood or change in color  She states she and her husband still plan to go on vacation for 1 week.  Advised to abstain froM ETOH, caffiene, chocolate, dairy,  and spicy foods.  Continue bland diet and take Prevacid 30 mg bid until her return.  Differential at this point is resolving alcoholic gastritis or early ulcer.  Will get H pylori upon her return and discuss upper endoscopy  To see me in office upon her return

## 2011-12-19 ENCOUNTER — Encounter: Payer: Self-pay | Admitting: Internal Medicine

## 2011-12-19 DIAGNOSIS — R1013 Epigastric pain: Secondary | ICD-10-CM | POA: Insufficient documentation

## 2011-12-19 DIAGNOSIS — F341 Dysthymic disorder: Secondary | ICD-10-CM | POA: Insufficient documentation

## 2011-12-20 ENCOUNTER — Telehealth: Payer: Self-pay | Admitting: *Deleted

## 2011-12-20 NOTE — Telephone Encounter (Signed)
Copy of labs mailed to pt's home address. 

## 2012-04-09 ENCOUNTER — Other Ambulatory Visit: Payer: Self-pay | Admitting: Internal Medicine

## 2012-04-24 ENCOUNTER — Ambulatory Visit (INDEPENDENT_AMBULATORY_CARE_PROVIDER_SITE_OTHER): Payer: 59 | Admitting: Internal Medicine

## 2012-04-24 ENCOUNTER — Ambulatory Visit (HOSPITAL_BASED_OUTPATIENT_CLINIC_OR_DEPARTMENT_OTHER)
Admission: RE | Admit: 2012-04-24 | Discharge: 2012-04-24 | Disposition: A | Discharge status: Home / Self Care | Payer: BC Managed Care – PPO | Source: Ambulatory Visit | Attending: Internal Medicine | Admitting: Internal Medicine

## 2012-04-24 ENCOUNTER — Encounter: Payer: Self-pay | Admitting: Internal Medicine

## 2012-04-24 VITALS — BP 106/65 | HR 62 | Resp 16 | Wt 116.2 lb

## 2012-04-24 DIAGNOSIS — S6991XA Unspecified injury of right wrist, hand and finger(s), initial encounter: Secondary | ICD-10-CM

## 2012-04-24 DIAGNOSIS — X58XXXA Exposure to other specified factors, initial encounter: Secondary | ICD-10-CM | POA: Insufficient documentation

## 2012-04-24 DIAGNOSIS — F329 Major depressive disorder, single episode, unspecified: Secondary | ICD-10-CM

## 2012-04-24 DIAGNOSIS — S59919A Unspecified injury of unspecified forearm, initial encounter: Secondary | ICD-10-CM

## 2012-04-24 DIAGNOSIS — S6990XA Unspecified injury of unspecified wrist, hand and finger(s), initial encounter: Secondary | ICD-10-CM

## 2012-04-24 DIAGNOSIS — S59909A Unspecified injury of unspecified elbow, initial encounter: Secondary | ICD-10-CM | POA: Insufficient documentation

## 2012-04-24 DIAGNOSIS — K2990 Gastroduodenitis, unspecified, without bleeding: Secondary | ICD-10-CM

## 2012-04-24 DIAGNOSIS — M25519 Pain in unspecified shoulder: Secondary | ICD-10-CM

## 2012-04-24 DIAGNOSIS — M25511 Pain in right shoulder: Secondary | ICD-10-CM | POA: Insufficient documentation

## 2012-04-24 DIAGNOSIS — F3289 Other specified depressive episodes: Secondary | ICD-10-CM

## 2012-04-24 DIAGNOSIS — K2970 Gastritis, unspecified, without bleeding: Secondary | ICD-10-CM

## 2012-04-24 MED ORDER — LEVOTHYROXINE SODIUM 75 MCG PO TABS: ORAL_TABLET | ORAL | Status: DC

## 2012-04-24 MED ORDER — BUPROPION HCL ER (XL) 150 MG PO TB24: 450.0000 mg | ORAL_TABLET | Freq: Every day | ORAL | Status: DC

## 2012-04-24 NOTE — Patient Instructions (Addendum)
See me as needed  Call me in am for xray results

## 2012-04-24 NOTE — Progress Notes (Signed)
Subjective:    Patient ID: Lacey Murphy, female    DOB: [birth year], 36 y.o.   MRN: [id number]  HPI Lacey Murphy is here for acute visit.  She is having R wrist pain for the last month.  Golf injury where she was swingling and club hit the dirt hard.  Some swelling no redness  She also had seen Dr. August Murphy for R should er injury form weight lifting.  Was given Voltaren and this did not help much.    No more epigastric pain.  She drinks occasionally and does not bother her.    Rare L flank discomfort none now  No Known Allergies Past Medical History  Diagnosis Date  . 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 Hammertoe 06/2011    right 4th  . Migraine   . Depression   . Hypothyroidism    Past Surgical History  Procedure Date  . Foot surgery     bilat.  . Breast enhancement surgery 1/13  . Bunionectomy 1/13    right   History   Social History  . Marital Status: Married    Spouse Name: N/A    Number of Children: N/A  . Years of Education: N/A   Occupational History  . Not on file.   Social History Main Topics  . Smoking status: Never Smoker   . Smokeless tobacco: Never Used  . Alcohol Use: 4.2 oz/week    7 Glasses of wine per week     socially  . Drug Use: No  . Sexually Active: Yes    Birth Control/ Protection: None   Other Topics Concern  . Not on file   Social History Narrative  . No narrative on file   Family History  Problem Relation Age of Onset  . Breast cancer Mother   . Hyperlipidemia Mother   . Colon polyps Mother   . Heart failure Father   . Prostate cancer Father   . Depression Sister   . Depression Brother   . Colon cancer Maternal Grandmother   . Colon polyps Maternal Grandmother   . Hypertension Paternal Grandmother   . Heart failure Paternal Grandfather    Patient Active Problem List  Diagnosis  . Depression  . Migraine  . Hypothyroidism  . Hammertoe  . Family history of colon cancer  . Family  history of breast cancer  . Epigastric abdominal pain  . Dysthymia   Current Outpatient Prescriptions on File Prior to Visit  Medication Sig Dispense Refill  . buPROPion (WELLBUTRIN XL) 150 MG 24 hr tablet Take 3 tablets (450 mg total) by mouth daily.  270 tablet  3  . levothyroxine (SYNTHROID, LEVOTHROID) 75 MCG tablet TAKE 1 AND 1/2 TABLETS BY MOUTH ON MON, WED, FRI. TAKE 1 TABLET ALL OTHER DAYS  92 tablet  0  . promethazine (PHENERGAN) 25 MG tablet Take 25 mg by mouth every 6 (six) hours as needed. Patient used this medication for nausea.      . ranitidine (ZANTAC) 150 MG capsule Take 1 capsule (150 mg total) by mouth daily.  28 capsule  0       Review of Systems See HPI    Objective:   Physical Exam Physical Exam  Nursing note and vitals reviewed.  Constitutional: She is oriented to person, place, and time. She appears well-developed and well-nourished.  HENT:  Head: Normocephalic and atraumatic.  Cardiovascular: Normal rate and regular rhythm. Exam  reveals no gallop and no friction rub.  No murmur heard.  Pulmonary/Chest: Breath sounds normal. She has no wheezes. She has no rales. ABD:  Soft nt/nd  bs Post no pain to deep palpation all quadrants  Neurological: She is alert and oriented to person, place, and time.  Skin: Skin is warm and dry.  Psychiatric: She has a normal mood and affect. Her behavior is normal.           Assessment & Plan:  R wrist pain  Will get xray today  R shoulder pain  Advised to follow with Dr Lacey Murphy  May need Mri or steroid injeciton  Epigastric pain/gastritis:   She declines H pylori today.  Will check if pain recurs.  Suspect ETOH gastritis after heavy drinking  L renal calculi.  Copy of CT given.  Does not wish any furhter imaging at this time  Shehas had influenza vaccine through [facility]  See me as needed  Hyhpothyroidism  Re-order synthroid  Depression  Reorder Wellbutrin

## 2012-04-25 ENCOUNTER — Telehealth: Payer: Self-pay | Admitting: *Deleted

## 2012-04-25 NOTE — Telephone Encounter (Signed)
Pt notified of wrist xray

## 2012-10-05 ENCOUNTER — Ambulatory Visit: Payer: Self-pay | Admitting: Family Medicine

## 2012-10-09 ENCOUNTER — Ambulatory Visit (INDEPENDENT_AMBULATORY_CARE_PROVIDER_SITE_OTHER): Payer: BC Managed Care – PPO | Admitting: Family Medicine

## 2012-10-09 ENCOUNTER — Encounter: Payer: Self-pay | Admitting: Family Medicine

## 2012-10-09 VITALS — BP 126/77 | HR 103 | Ht 62.0 in | Wt 115.0 lb

## 2012-10-09 DIAGNOSIS — M25551 Pain in right hip: Secondary | ICD-10-CM

## 2012-10-09 DIAGNOSIS — M25569 Pain in unspecified knee: Secondary | ICD-10-CM

## 2012-10-09 DIAGNOSIS — M25559 Pain in unspecified hip: Secondary | ICD-10-CM

## 2012-10-09 DIAGNOSIS — M25561 Pain in right knee: Secondary | ICD-10-CM

## 2012-10-09 NOTE — Patient Instructions (Addendum)
Your hip pain is due to iliopsoas strain with subsequent snapping hip. You're right that labral tears can sometimes cause pain in this location but typically not while running by itself and not with a normal logroll and labrum compression test. You should feel significantly better over the next 4 weeks. Use pain as your guide regarding activities - if limping or pain is worse than a 3 on a scale of 1-10, avoid those activities. Specifically avoid cutting or side to side motions. Ok to swim, use elliptical, cycle if pain not severe - over next 1-2 weeks you should be able to add in jogging again. Start standing hip rotation 3 sets of 10, hip stretches (pelvis at end of table, leg extended and leg hang for 20 seconds; prone hip extension on handout). Follow up with me in 5 weeks. If not improving could consider MR arthrogram of hip.

## 2012-10-11 ENCOUNTER — Encounter: Payer: Self-pay | Admitting: Family Medicine

## 2012-10-11 DIAGNOSIS — M25551 Pain in right hip: Secondary | ICD-10-CM | POA: Insufficient documentation

## 2012-10-11 DIAGNOSIS — M25561 Pain in right knee: Secondary | ICD-10-CM | POA: Insufficient documentation

## 2012-10-11 NOTE — Progress Notes (Signed)
Subjective:    Patient ID: Lacey Murphy, female    DOB: April 06, 1976, 37 y.o.   MRN: [id number]  PCP: Dr. Constance Goltz  HPI 37 yo F here for right hip pain.  Patient states she is very physically active especially with crossfit. Has been running with her son as he's training to do the [employer] run. About 1 1/2 weeks ago while running - was about 1/4 mile into a run when she felt a pop and pain in anterior right hip. Was not turning at the time. Now having problems pivoting. No swelling or bruising of hip. Has improved since then but still pops especially with rotation motions of the hip. No back pain No numbness/tingling.  Past Medical History  Diagnosis Date  . 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 Hammertoe 06/2011    right 4th  . Migraine   . Depression   . Hypothyroidism     Current Outpatient Prescriptions on File Prior to Visit  Medication Sig Dispense Refill  . buPROPion (WELLBUTRIN XL) 150 MG 24 hr tablet Take 3 tablets (450 mg total) by mouth daily.  270 tablet  3  . levothyroxine (SYNTHROID, LEVOTHROID) 75 MCG tablet Take one and 1/2 tablet on M,W, Fri and one tablet all other days  100 tablet  3  . diclofenac (VOLTAREN) 75 MG EC tablet Take 75 mg by mouth as needed.      . promethazine (PHENERGAN) 25 MG tablet Take 25 mg by mouth every 6 (six) hours as needed. Patient used this medication for nausea.      . ranitidine (ZANTAC) 150 MG capsule Take 1 capsule (150 mg total) by mouth daily.  28 capsule  0   No current facility-administered medications on file prior to visit.    Past Surgical History  Procedure Laterality Date  . Foot surgery      bilat.  . Breast enhancement surgery  1/13  . Bunionectomy  1/13    right    No Known Allergies  History   Social History  . Marital Status: Married    Spouse Name: N/A    Number of Children: N/A  . Years of Education: N/A   Occupational History  . Not on file.    Social History Main Topics  . Smoking status: Never Smoker   . Smokeless tobacco: Never Used  . Alcohol Use: 4.2 oz/week    7 Glasses of wine per week     Comment: socially  . Drug Use: No  . Sexually Active: Yes    Birth Control/ Protection: None   Other Topics Concern  . Not on file   Social History Narrative  . No narrative on file    Family History  Problem Relation Age of Onset  . Breast cancer Mother   . Hyperlipidemia Mother   . Colon polyps Mother   . Heart failure Father   . Prostate cancer Father   . Diabetes Father   . Depression Sister   . Depression Brother   . Colon cancer Maternal Grandmother   . Colon polyps Maternal Grandmother   . Hypertension Paternal Grandmother   . Heart failure Paternal Grandfather   . Heart attack Neg Hx     BP 126/77  Pulse 103  Ht 5\' 2"  (1.575 m)  Wt 115 lb (52.164 kg)  BMI 21.03 kg/m2  Review of Systems See HPI above.    Objective:  Physical Exam Gen: NAD  Back/R Hip: No gross deformity, scoliosis. TTP anterior right hip over iliopsoas tendon.  No midline or bony TTP.  No other hip tenderness. FROM back and hip without pain. Strength LEs 5/5 all muscle groups.   Negative SLRs. Sensation intact to light touch bilaterally. Negative logroll bilateral hips Negative fabers and piriformis stretches.    Assessment & Plan:  1. Right hip pain - her history and exam are consistent with iliopsoas strain (straight line running, pull/pop anterior hip with subsequent popping/snapping hip) that is improving.  Start strengthening exercises and stretching - handouts provided.  She's worried about a labral tear but we discussed mechanism and exam do not fit with this and that treatment is conservative for this initially as well.  Use pain as guide for activities.  Consider MR arthrogram if not improving over next 4-6 weeks.  2. 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   Discussed VMO and hip abduction strengthening, correction for overpronation with inserts or scaphoid pads.

## 2012-10-11 NOTE — Assessment & Plan Note (Signed)
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  Discussed VMO and hip abduction strengthening, correction for overpronation with inserts or scaphoid pads.

## 2012-10-11 NOTE — Assessment & Plan Note (Signed)
her history and exam are consistent with iliopsoas strain (straight line running, pull/pop anterior hip with subsequent popping/snapping hip) that is improving.  Start strengthening exercises and stretching - handouts provided.  She's worried about a labral tear but we discussed mechanism and exam do not fit with this and that treatment is conservative for this initially as well.  Use pain as guide for activities.  Consider MR arthrogram if not improving over next 4-6 weeks.

## 2012-10-16 ENCOUNTER — Telehealth: Payer: Self-pay | Admitting: *Deleted

## 2012-10-16 NOTE — Telephone Encounter (Signed)
Pt wants a RX for sonata  States that you recommended this previously

## 2012-10-17 NOTE — Telephone Encounter (Signed)
LVM message regarding Lunesta awaiting for return call

## 2012-10-17 NOTE — Telephone Encounter (Signed)
Pt cannot take Lunesta or Ambien pt has taken both of these  With side effects " makes me feel really yucky"

## 2012-10-17 NOTE — Telephone Encounter (Signed)
Lacey Murphy  What I recall discussing is [employer] will likely require a pre-authorization.  If she wishes we can try to order [facility] for her or she can have office visit to discuss other options for sleep

## 2012-10-19 ENCOUNTER — Other Ambulatory Visit: Payer: Self-pay | Admitting: Internal Medicine

## 2012-10-19 MED ORDER — ZALEPLON 5 MG PO CAPS: 5.0000 mg | ORAL_CAPSULE | Freq: Every day | ORAL | Status: DC

## 2012-10-25 ENCOUNTER — Telehealth: Payer: Self-pay | Admitting: *Deleted

## 2012-10-25 NOTE — Telephone Encounter (Signed)
Remained on hold with insurance co x 16 min

## 2012-10-26 NOTE — Telephone Encounter (Signed)
On hold x 32 min finally received authorization for 30 day supply x one year

## 2012-12-05 ENCOUNTER — Other Ambulatory Visit: Payer: Self-pay | Admitting: *Deleted

## 2012-12-05 DIAGNOSIS — Z803 Family history of malignant neoplasm of breast: Secondary | ICD-10-CM

## 2012-12-05 DIAGNOSIS — E039 Hypothyroidism, unspecified: Secondary | ICD-10-CM

## 2012-12-05 DIAGNOSIS — Z139 Encounter for screening, unspecified: Secondary | ICD-10-CM

## 2012-12-05 DIAGNOSIS — Z800 Family history of malignant neoplasm of digestive organs: Secondary | ICD-10-CM

## 2012-12-06 ENCOUNTER — Encounter: Payer: Self-pay | Admitting: Internal Medicine

## 2012-12-06 ENCOUNTER — Ambulatory Visit (INDEPENDENT_AMBULATORY_CARE_PROVIDER_SITE_OTHER): Payer: BC Managed Care – PPO | Admitting: Internal Medicine

## 2012-12-06 VITALS — BP 100/70 | HR 81 | Temp 99.8°F | Resp 18 | Wt 119.0 lb

## 2012-12-06 DIAGNOSIS — N200 Calculus of kidney: Secondary | ICD-10-CM

## 2012-12-06 DIAGNOSIS — R1013 Epigastric pain: Secondary | ICD-10-CM

## 2012-12-06 DIAGNOSIS — R002 Palpitations: Secondary | ICD-10-CM

## 2012-12-06 LAB — COMPREHENSIVE METABOLIC PANEL
ALT: 38 U/L — ABNORMAL HIGH (ref 0–35)
AST: 30 U/L (ref 0–37)
Alkaline Phosphatase: 61 U/L (ref 39–117)
Creat: 1 mg/dL (ref 0.50–1.10)
Total Bilirubin: 0.6 mg/dL (ref 0.3–1.2)

## 2012-12-06 LAB — LIPID PANEL
HDL: 71 mg/dL (ref >39)
LDL Cholesterol: 71 mg/dL (ref 0–99)
Triglycerides: 109 mg/dL (ref <150)
VLDL: 22 mg/dL (ref 0–40)

## 2012-12-06 LAB — CBC WITH DIFFERENTIAL/PLATELET
Basophils Absolute: 0 10*3/uL (ref 0.0–0.1)
Eosinophils Absolute: 0 10*3/uL (ref 0.0–0.7)
Eosinophils Relative: 0 % (ref 0–5)
MCH: 29.3 pg (ref 26.0–34.0)
MCV: 88.7 fL (ref 78.0–100.0)
Platelets: 227 10*3/uL (ref 150–400)
RDW: 13.5 % (ref 11.5–15.5)
WBC: 9.8 10*3/uL (ref 4.0–10.5)

## 2012-12-06 NOTE — Progress Notes (Signed)
Subjective:    Patient ID: Lacey Murphy, female    DOB: 02-Oct-1975, 37 y.o.   MRN: [id number]  HPI  Lacey Murphy is here for acute visit.  (she was 20 mins late for CPE and that will be rescheduled)  She has noted 2 weeks of palpitations lasting less than a minute,  Occasionally will feel SOB.  No recent episodes.  No chest pain, pressure or near syncope  She drinks two cups of coffee in am only  She has not had any further epigastric discomfort.  She reports she stopped taking high dose vitamin C and epigastric pain improved  History of renal stone  No L flank pain    No Known Allergies Past Medical History  Diagnosis Date  . 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 Hammertoe 06/2011    right 4th  . Migraine   . Depression   . Hypothyroidism    Past Surgical History  Procedure Laterality Date  . Foot surgery      bilat.  . Breast enhancement surgery  1/13  . Bunionectomy  1/13    right   History   Social History  . Marital Status: Married    Spouse Name: N/A    Number of Children: N/A  . Years of Education: N/A   Occupational History  . Not on file.   Social History Main Topics  . Smoking status: Never Smoker   . Smokeless tobacco: Never Used  . Alcohol Use: 4.2 oz/week    7 Glasses of wine per week     Comment: socially  . Drug Use: No  . Sexually Active: Yes    Birth Control/ Protection: None   Other Topics Concern  . Not on file   Social History Narrative  . No narrative on file   Family History  Problem Relation Age of Onset  . Breast cancer Mother   . Hyperlipidemia Mother   . Colon polyps Mother   . Heart failure Father   . Prostate cancer Father   . Diabetes Father   . Depression Sister   . Depression Brother   . Colon cancer Maternal Grandmother   . Colon polyps Maternal Grandmother   . Hypertension Paternal Grandmother   . Heart failure Paternal Grandfather   . Heart attack Neg Hx    Patient  Active Problem List   Diagnosis Date Noted  . Right hip pain 10/11/2012  . Right knee pain 10/11/2012  . Gastritis 04/24/2012  . Right shoulder pain 04/24/2012  . Epigastric abdominal pain 12/19/2011  . Dysthymia 12/19/2011  . Family history of colon cancer 09/02/2011  . Family history of breast cancer 09/02/2011  . Depression   . Migraine   . Hypothyroidism   . Hammertoe 06/29/2011   Current Outpatient Prescriptions on File Prior to Visit  Medication Sig Dispense Refill  . buPROPion (WELLBUTRIN XL) 150 MG 24 hr tablet Take 3 tablets (450 mg total) by mouth daily.  270 tablet  3  . diclofenac (VOLTAREN) 75 MG EC tablet Take 75 mg by mouth as needed.      Marland Kitchen levothyroxine (SYNTHROID, LEVOTHROID) 75 MCG tablet Take one and 1/2 tablet on M,W, Fri and one tablet all other days  100 tablet  3  . loratadine-pseudoephedrine (CLARITIN-D 24-HOUR) 10-240 MG per 24 hr tablet Take 1 tablet by mouth daily.      . zaleplon (SONATA) 5 MG capsule Take 1  capsule (5 mg total) by mouth at bedtime.  30 capsule  0   No current facility-administered medications on file prior to visit.      Review of Systems    see HPI Objective:   Physical Exam Physical Exam  Nursing note and vitals reviewed.  Constitutional: She is oriented to person, place, and time. She appears well-developed and well-nourished.  HENT:  Head: Normocephalic and atraumatic.  Cardiovascular: Normal rate and regular rhythm. Exam reveals no gallop and no friction rub.  No murmur heard.  Pulmonary/Chest: Breath sounds normal. She has no wheezes. She has no rales.  Neurological: She is alert and oriented to person, place, and time.  Skin: Skin is warm and dry.  Psychiatric: She has a normal mood and affect. Her behavior is normal.             Assessment & Plan:  Palpitations  EKG NSR  TWI in V1 nonspecific.  Symptoms last less than one minute.   Decrease  caffiene intake.  Counseled if any further symptoms or syncope or  chest pain,  To seek urgent care at that time.  If symptoms recur will get Holter evaluation  Will check TSH today  Epigastric pain  She has stopped vitamin C  And improved  L renal stone non obstructing on CT and pt asymptomatic

## 2012-12-06 NOTE — Patient Instructions (Addendum)
Activate my chart    Reshedule  CPE

## 2012-12-07 ENCOUNTER — Encounter: Payer: Self-pay | Admitting: Internal Medicine

## 2012-12-07 ENCOUNTER — Encounter: Payer: Self-pay | Admitting: *Deleted

## 2012-12-07 DIAGNOSIS — R740 Nonspecific elevation of levels of transaminase and lactic acid dehydrogenase [LDH]: Secondary | ICD-10-CM

## 2012-12-07 LAB — T3, FREE: T3, Free: 2.3 pg/mL (ref 2.3–4.2)

## 2012-12-08 ENCOUNTER — Encounter: Payer: Self-pay | Admitting: Internal Medicine

## 2013-01-03 ENCOUNTER — Encounter: Payer: Self-pay | Admitting: Internal Medicine

## 2013-01-04 ENCOUNTER — Encounter: Payer: Self-pay | Admitting: Internal Medicine

## 2013-05-03 ENCOUNTER — Other Ambulatory Visit: Payer: Self-pay

## 2013-05-09 IMAGING — CR DG WRIST COMPLETE 3+V*R*
4 series · 4 of 4 positions shown · non-contrast
Comparison: None.

CLINICAL DATA: Right wrist pain post injury

RIGHT WRIST - COMPLETE 3+ VIEW

[x wrist pa right]
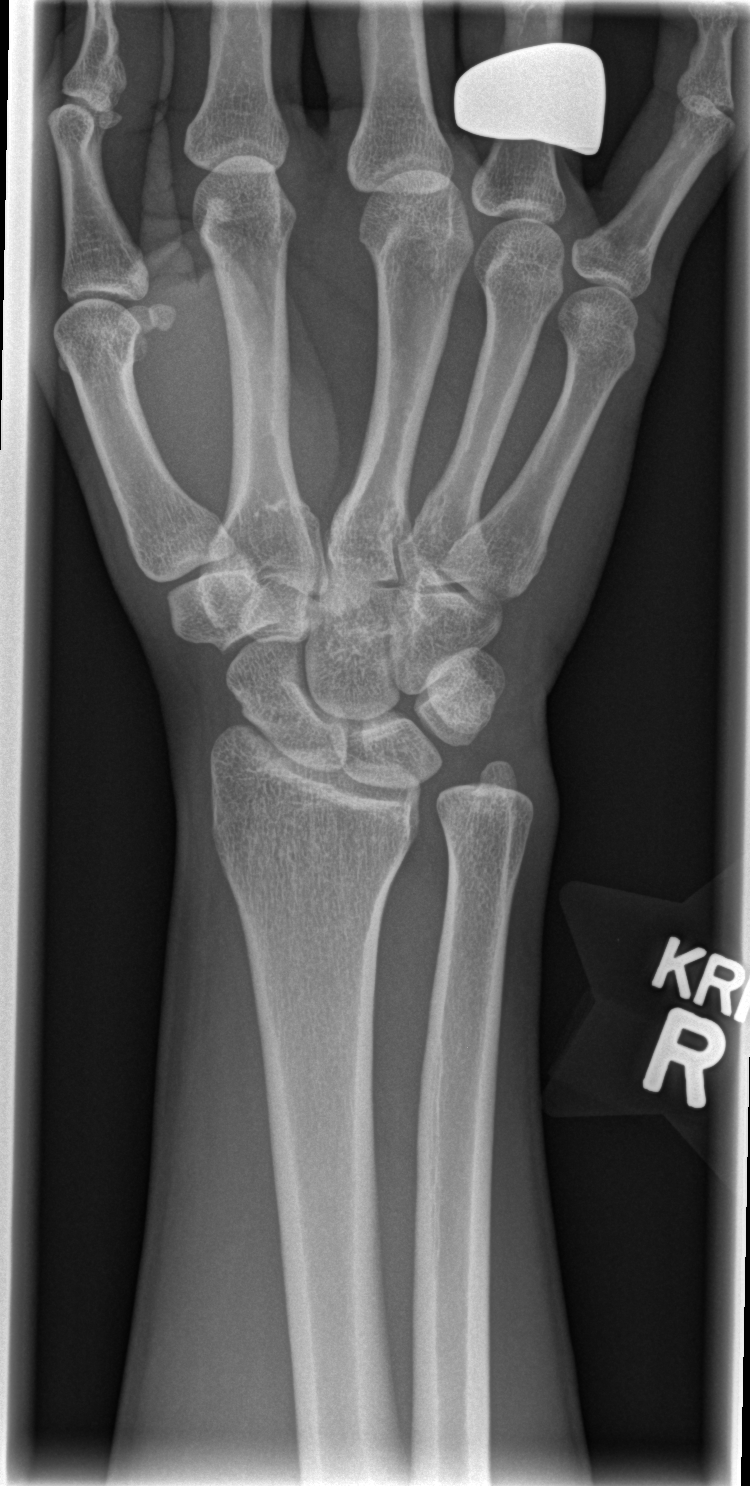

[x wrist obl right]
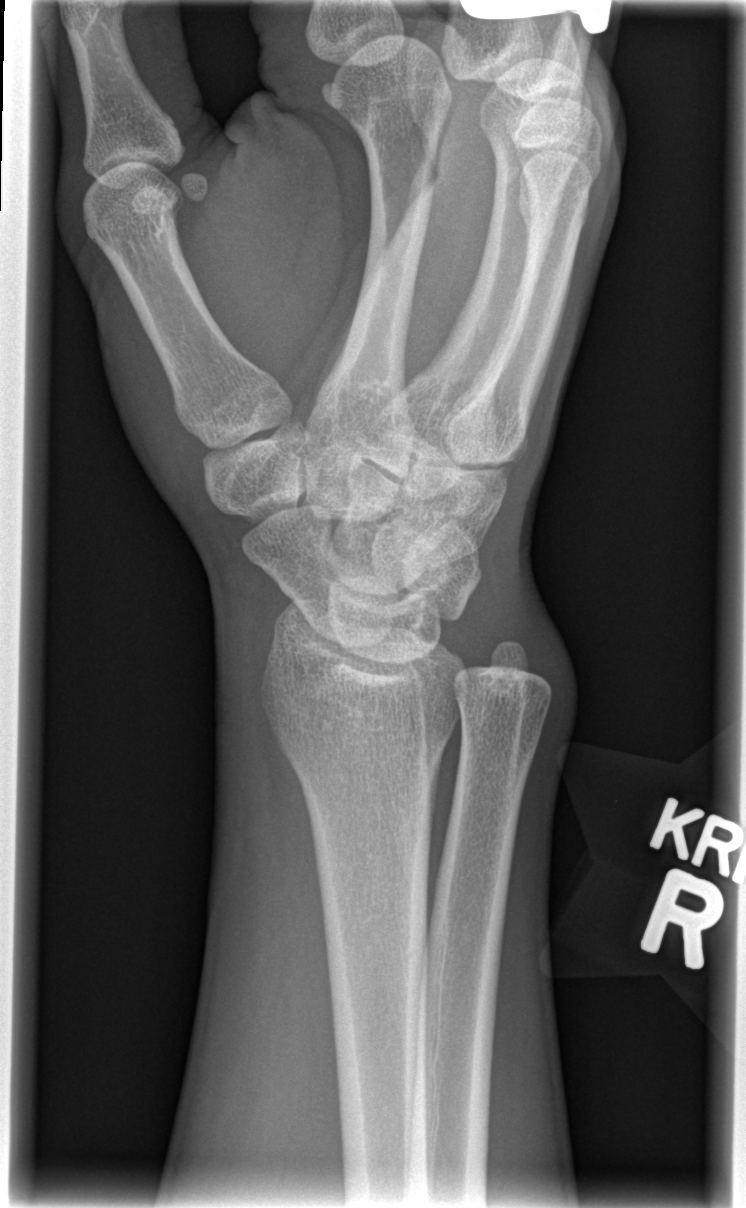

[x wrist lat right]
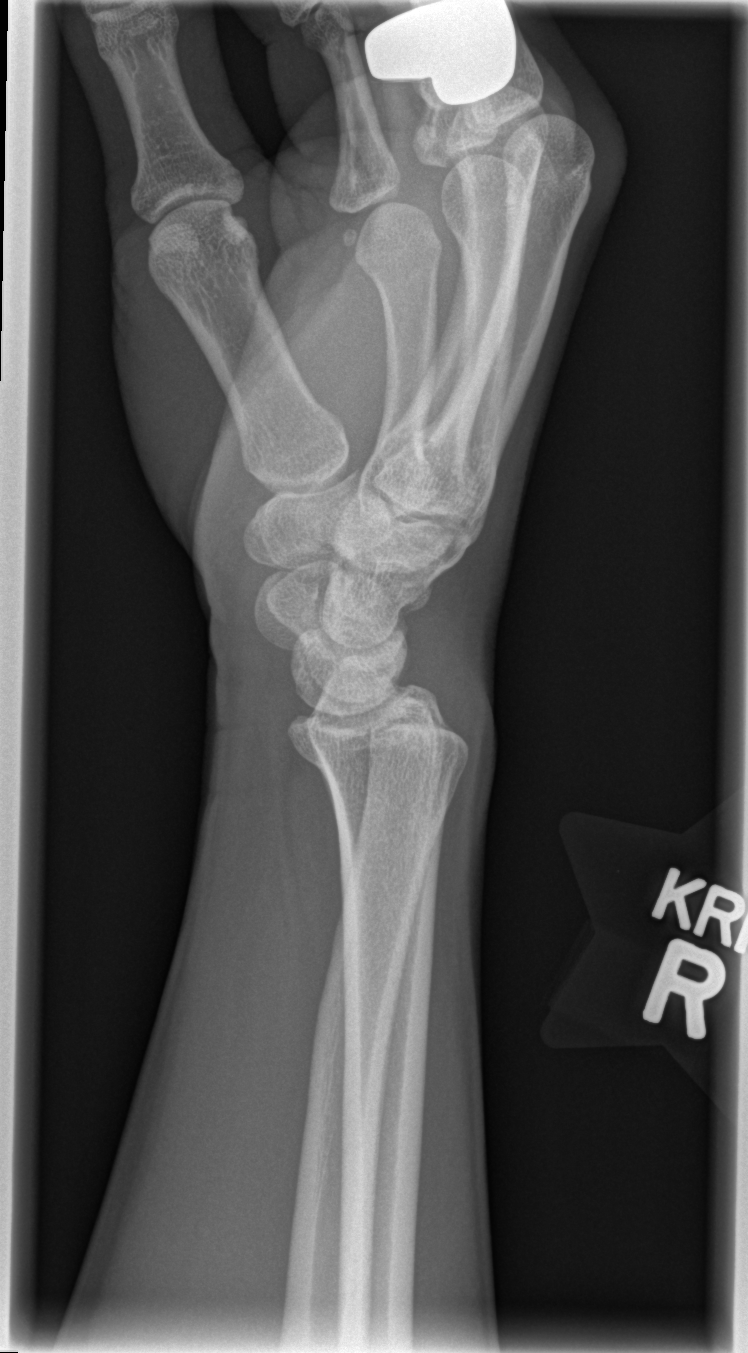

[x navicular]
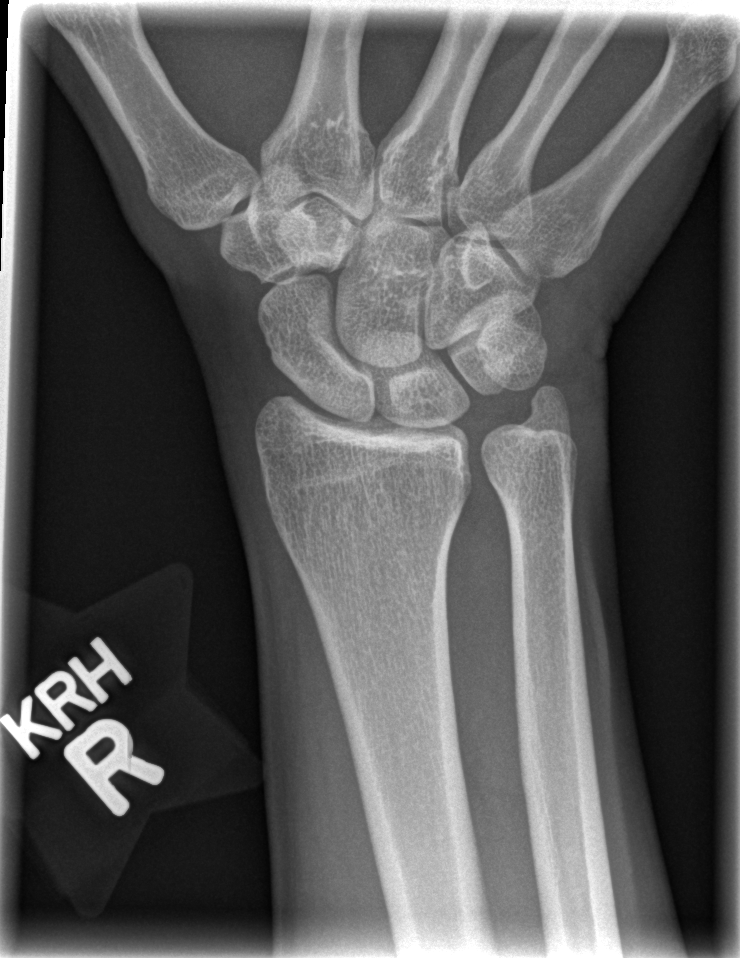

[4 of 4 positions shown; findings below may reference images not displayed]

FINDINGS: Four views of the right wrist submitted.  No acute
fracture or subluxation.
IMPRESSION: No acute fracture or subluxation.

## 2013-05-14 ENCOUNTER — Telehealth: Payer: Self-pay | Admitting: *Deleted

## 2013-05-14 DIAGNOSIS — F329 Major depressive disorder, single episode, unspecified: Secondary | ICD-10-CM

## 2013-05-14 NOTE — Telephone Encounter (Signed)
Refill request

## 2013-05-17 ENCOUNTER — Other Ambulatory Visit: Payer: Self-pay | Admitting: Internal Medicine

## 2013-05-17 NOTE — Telephone Encounter (Signed)
Refill request

## 2014-01-24 ENCOUNTER — Ambulatory Visit (INDEPENDENT_AMBULATORY_CARE_PROVIDER_SITE_OTHER): Payer: BC Managed Care – PPO | Admitting: Internal Medicine

## 2014-01-24 ENCOUNTER — Encounter: Payer: Self-pay | Admitting: Internal Medicine

## 2014-01-24 VITALS — BP 99/64 | HR 74 | Temp 97.1°F | Resp 17 | Ht 62.0 in | Wt 119.0 lb

## 2014-01-24 DIAGNOSIS — F32A Depression, unspecified: Secondary | ICD-10-CM

## 2014-01-24 DIAGNOSIS — F329 Major depressive disorder, single episode, unspecified: Secondary | ICD-10-CM | POA: Diagnosis not present

## 2014-01-24 DIAGNOSIS — F341 Dysthymic disorder: Secondary | ICD-10-CM

## 2014-01-24 DIAGNOSIS — E038 Other specified hypothyroidism: Secondary | ICD-10-CM | POA: Diagnosis not present

## 2014-01-24 DIAGNOSIS — Z139 Encounter for screening, unspecified: Secondary | ICD-10-CM

## 2014-01-24 DIAGNOSIS — F3289 Other specified depressive episodes: Secondary | ICD-10-CM | POA: Diagnosis not present

## 2014-01-24 NOTE — Patient Instructions (Signed)
CPE  In 2016

## 2014-01-24 NOTE — Progress Notes (Signed)
Subjective:    Patient ID: Lacey Murphy, female    DOB: 03/24/1976, 38 y.o.   MRN: [id number]  HPI  Lacey Murphy returns for follow up I have not seen her in a year.  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    Doing well sometimes is fatigued.   Has not had labs recently  No Known Allergies Past Medical History  Diagnosis Date  . 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 Hammertoe 06/2011    right 4th  . Migraine   . Depression   . Hypothyroidism    Past Surgical History  Procedure Laterality Date  . Foot surgery      bilat.  . Breast enhancement surgery  1/13  . Bunionectomy  1/13    right   History   Social History  . Marital Status: Married    Spouse Name: N/A    Number of Children: N/A  . Years of Education: N/A   Occupational History  . Not on file.   Social History Main Topics  . Smoking status: Never Smoker   . Smokeless tobacco: Never Used  . Alcohol Use: 4.2 oz/week    7 Glasses of wine per week     Comment: socially  . Drug Use: No  . Sexual Activity: Yes    Birth Control/ Protection: None   Other Topics Concern  . Not on file   Social History Narrative  . No narrative on file   Family History  Problem Relation Age of Onset  . Breast cancer Mother   . Hyperlipidemia Mother   . Colon polyps Mother   . Heart failure Father   . Prostate cancer Father   . Diabetes Father   . Depression Sister   . Depression Brother   . Colon cancer Maternal Grandmother   . Colon polyps Maternal Grandmother   . Hypertension Paternal Grandmother   . Heart failure Paternal Grandfather   . Heart attack Neg Hx    Patient Active Problem List   Diagnosis Date Noted  . ALT (SGPT) level raised 12/07/2012  . Palpitations 12/06/2012  . Renal calculus, left 12/06/2012  . Right hip pain 10/11/2012  . Right knee pain 10/11/2012  . Gastritis 04/24/2012  . Right shoulder pain 04/24/2012  . Epigastric abdominal pain  12/19/2011  . Dysthymia 12/19/2011  . Family history of colon cancer 09/02/2011  . Family history of breast cancer 09/02/2011  . Depression   . Migraine   . Hypothyroidism   . Hammertoe 06/29/2011   Current Outpatient Prescriptions on File Prior to Visit  Medication Sig Dispense Refill  . buPROPion (WELLBUTRIN XL) 150 MG 24 hr tablet TAKE 3 TABLETS (450 MG TOTAL) BY MOUTH DAILY.  270 tablet  3  . levothyroxine (SYNTHROID, LEVOTHROID) 75 MCG tablet TAKE 1 AND 1/2 TABLETS ON MON WED AND FRI AND 1 TABLET ALL OTHER DAYS  100 tablet  3  . naproxen sodium (ANAPROX) 220 MG tablet Take 220 mg by mouth 2 (two) times daily with a meal.      . diclofenac (VOLTAREN) 75 MG EC tablet Take 75 mg by mouth as needed.      . loratadine-pseudoephedrine (CLARITIN-D 24-HOUR) 10-240 MG per 24 hr tablet Take 1 tablet by mouth daily.      . Multiple Vitamins-Minerals (MULTI FOR HER PO) Take 1 tablet by mouth daily.      . zaleplon (  SONATA) 5 MG capsule Take 1 capsule (5 mg total) by mouth at bedtime.  30 capsule  0   No current facility-administered medications on file prior to visit.      Review of Systems    see HPI Objective:   Physical Exam Physical Exam  Nursing note and vitals reviewed.  Constitutional: She is oriented to person, place, and time. She appears well-developed and well-nourished.  HENT:  Head: Normocephalic and atraumatic.  Cardiovascular: Normal rate and regular rhythm. Exam reveals no gallop and no friction rub.  No murmur heard.  Pulmonary/Chest: Breath sounds normal. She has no wheezes. She has no rales.  Neurological: She is alert and oriented to person, place, and time.  Skin: Skin is warm and dry.  Psychiatric: She has a normal mood and affect. Her behavior is normal.              Assessment & Plan:  Hypothyroidism  wil refill synthroid  Check TSH with all labs today.  Synthroid  75 mcg one table Tues,Thurs, sat, and Sunday.   1 and 1/2 M, W,  friday  Dysthymia/depression  On Wellbutrin  150 mg xl   3 tabls daily  Labs schedule [facility]

## 2014-03-06 ENCOUNTER — Encounter: Payer: Self-pay | Admitting: Internal Medicine

## 2014-03-06 LAB — CBC WITH DIFFERENTIAL/PLATELET
Basophils Absolute: 0 10*3/uL (ref 0.0–0.1)
Basophils Relative: 0 % (ref 0–1)
EOS ABS: 0.1 10*3/uL (ref 0.0–0.7)
EOS PCT: 1 % (ref 0–5)
HEMATOCRIT: 40.5 % (ref 36.0–46.0)
Hemoglobin: 13.8 g/dL (ref 12.0–15.0)
LYMPHS ABS: 1.6 10*3/uL (ref 0.7–4.0)
LYMPHS PCT: 19 % (ref 12–46)
MCH: 29.9 pg (ref 26.0–34.0)
MCHC: 34.1 g/dL (ref 30.0–36.0)
MCV: 87.9 fL (ref 78.0–100.0)
MONO ABS: 0.4 10*3/uL (ref 0.1–1.0)
Monocytes Relative: 5 % (ref 3–12)
Neutro Abs: 6.2 10*3/uL (ref 1.7–7.7)
Neutrophils Relative %: 75 % (ref 43–77)
PLATELETS: 231 10*3/uL (ref 150–400)
RBC: 4.61 MIL/uL (ref 3.87–5.11)
RDW: 13.3 % (ref 11.5–15.5)
WBC: 8.3 10*3/uL (ref 4.0–10.5)

## 2014-03-06 LAB — COMPLETE METABOLIC PANEL WITH GFR
ALT: 32 U/L (ref 0–35)
AST: 34 U/L (ref 0–37)
Albumin: 4.6 g/dL (ref 3.5–5.2)
Alkaline Phosphatase: 63 U/L (ref 39–117)
BUN: 18 mg/dL (ref 6–23)
CO2: 29 meq/L (ref 19–32)
CREATININE: 1.03 mg/dL (ref 0.50–1.10)
Calcium: 9.3 mg/dL (ref 8.4–10.5)
Chloride: 102 mEq/L (ref 96–112)
GFR, EST AFRICAN AMERICAN: 79 mL/min
GFR, Est Non African American: 69 mL/min
Glucose, Bld: 100 mg/dL — ABNORMAL HIGH (ref 70–99)
Potassium: 3.7 mEq/L (ref 3.5–5.3)
SODIUM: 139 meq/L (ref 135–145)
TOTAL PROTEIN: 6.3 g/dL (ref 6.0–8.3)
Total Bilirubin: 0.4 mg/dL (ref 0.2–1.2)

## 2014-03-06 LAB — TSH: TSH: 3.017 u[IU]/mL (ref 0.350–4.500)

## 2014-03-06 LAB — T3, FREE: T3 FREE: 2.5 pg/mL (ref 2.3–4.2)

## 2014-03-06 LAB — LIPID PANEL
CHOL/HDL RATIO: 2.2 ratio
Cholesterol: 172 mg/dL (ref 0–200)
HDL: 80 mg/dL (ref >39)
LDL Cholesterol: 78 mg/dL (ref 0–99)
Triglycerides: 72 mg/dL (ref <150)
VLDL: 14 mg/dL (ref 0–40)

## 2014-03-06 LAB — VITAMIN D 25 HYDROXY (VIT D DEFICIENCY, FRACTURES): Vit D, 25-Hydroxy: 45 ng/mL (ref 30–89)

## 2014-03-06 LAB — T4, FREE: FREE T4: 1.22 ng/dL (ref 0.80–1.80)

## 2014-03-26 ENCOUNTER — Encounter: Payer: Self-pay | Admitting: *Deleted

## 2014-04-02 ENCOUNTER — Encounter: Payer: Self-pay | Admitting: Internal Medicine

## 2014-04-29 ENCOUNTER — Encounter: Payer: Self-pay | Admitting: Internal Medicine

## 2014-04-29 ENCOUNTER — Other Ambulatory Visit: Payer: Self-pay | Admitting: Internal Medicine

## 2014-04-29 NOTE — Telephone Encounter (Signed)
Refill request

## 2014-05-08 ENCOUNTER — Other Ambulatory Visit: Payer: Self-pay | Admitting: Internal Medicine

## 2014-05-09 NOTE — Telephone Encounter (Signed)
Refill request

## 2014-08-26 ENCOUNTER — Ambulatory Visit (INDEPENDENT_AMBULATORY_CARE_PROVIDER_SITE_OTHER): Payer: BLUE CROSS/BLUE SHIELD | Admitting: Family Medicine

## 2014-08-26 ENCOUNTER — Encounter: Payer: Self-pay | Admitting: Family Medicine

## 2014-08-26 VITALS — BP 108/69 | HR 84 | Ht 62.0 in | Wt 118.0 lb

## 2014-08-26 DIAGNOSIS — M545 Low back pain, unspecified: Secondary | ICD-10-CM

## 2014-08-26 MED ORDER — HYDROCODONE-ACETAMINOPHEN 5-325 MG PO TABS: 1.0000 | ORAL_TABLET | Freq: Four times a day (QID) | ORAL | Status: DC | PRN

## 2014-08-26 MED ORDER — PREDNISONE (PAK) 10 MG PO TABS: ORAL_TABLET | ORAL | Status: DC

## 2014-08-26 MED ORDER — CYCLOBENZAPRINE HCL 10 MG PO TABS: 10.0000 mg | ORAL_TABLET | Freq: Three times a day (TID) | ORAL | Status: DC | PRN

## 2014-08-26 NOTE — Patient Instructions (Signed)
I'm concerned you herniated a disc in your low back. A prednisone dose pack is the best option for immediate relief and may be prescribed. Day after finishing prednisone start meloxicam with food for pain and inflammation. Norco as needed for severe pain (no driving on this medicine). Flexeril as needed for muscle spasms (no driving on this medicine if it makes you sleepy). Stay as active as possible. Physical therapy has been shown to be helpful as well but not when you're in this much pain. Strengthening of low back muscles, abdominal musculature are key for long term pain relief. If not improving, will consider further imaging (MRI). Call me in a week to let me know how you're doing - if improving would add physical therapy at that time, place you on light duty (no stooping, bending, crawling, squatting, lifting more than 15 pounds). Follow up with me in 1 month.

## 2014-08-27 DIAGNOSIS — M545 Low back pain, unspecified: Secondary | ICD-10-CM | POA: Insufficient documentation

## 2014-08-27 NOTE — Progress Notes (Signed)
PCP: SCHOENHOFF,DEBBIE, MD  Subjective:   HPI: Patient is a 39 y.o. female here for severe low back pain.  Patient reports she had a little more low back fatigue, soreness following working out leading up to injury on 2/27. At that time she bent over to pick up some bags and felt a sharp pain, severe pain in low back that grabbed her. Couldn't move following this. Able to crawl up stairs and lie down in bed. Has been taking flexeril, demerol, meloxicam. No radiation into legs. Using walking sticks to get around. No prior issues with low back. No bowel/bladder dysfunction. No numbness/tingling.  Past Medical History  Diagnosis Date  . 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 Hammertoe 06/2011    right 4th  . Migraine   . Depression   . Hypothyroidism     Current Outpatient Prescriptions on File Prior to Visit  Medication Sig Dispense Refill  . buPROPion (WELLBUTRIN XL) 150 MG 24 hr tablet TAKE 3 TABLETS BY MOUTH EVERY DAY 270 tablet 3  . levothyroxine (SYNTHROID, LEVOTHROID) 75 MCG tablet TAKE 1 AND 1/2 TABLETS ON MON WED AND FRI AND 1 TABLET ALL OTHER DAYS 100 tablet 3  . Multiple Vitamins-Minerals (MULTI FOR HER PO) Take 1 tablet by mouth daily.    . zaleplon (SONATA) 5 MG capsule Take 1 capsule (5 mg total) by mouth at bedtime. 30 capsule 0   No current facility-administered medications on file prior to visit.    Past Surgical History  Procedure Laterality Date  . Foot surgery      bilat.  . Breast enhancement surgery  1/13  . Bunionectomy  1/13    right    No Known Allergies  History   Social History  . Marital Status: Married    Spouse Name: N/A  . Number of Children: N/A  . Years of Education: N/A   Occupational History  . Not on file.   Social History Main Topics  . Smoking status: Never Smoker   . Smokeless tobacco: Never Used  . Alcohol Use: 4.2 oz/week    7 Glasses of wine per week     Comment:  socially  . Drug Use: No  . Sexual Activity: Yes    Birth Control/ Protection: None   Other Topics Concern  . Not on file   Social History Narrative    Family History  Problem Relation Age of Onset  . Breast cancer Mother   . Hyperlipidemia Mother   . Colon polyps Mother   . Heart failure Father   . Prostate cancer Father   . Diabetes Father   . Depression Sister   . Depression Brother   . Colon cancer Maternal Grandmother   . Colon polyps Maternal Grandmother   . Hypertension Paternal Grandmother   . Heart failure Paternal Grandfather   . Heart attack Neg Hx     BP 108/69 mmHg  Pulse 84  Ht 5\' 2"  (1.575 m)  Wt 118 lb (53.524 kg)  BMI 21.58 kg/m2  Review of Systems: See HPI above.    Objective:  Physical Exam:  Gen: NAD, uncomfortable  Back: No gross deformity, scoliosis. No focal TTP .  No midline or bony TTP. Very limited motion due to pain. Strength LEs 5/5 all muscle groups.   2+ MSRs in patellar and achilles tendons, equal bilaterally. Pain with bilateral SLR but no radiation into extremities. Sensation intact to  light touch bilaterally. Negative logroll bilateral hips    Assessment & Plan:  1. Low back pain - concerning for a disc herniation.  Start with prednisone dose pack, transition to nsaid.  Norco and flexeril as needed.  Call us in 1 week to let us know how she's doing - if not improving consider radiographs, MRI.  If improving would add physical therapy and f/u in 1 month.

## 2014-08-27 NOTE — Assessment & Plan Note (Signed)
concerning for a disc herniation.  Start with prednisone dose pack, transition to nsaid.  Norco and flexeril as needed.  Call us in 1 week to let us know how she's doing - if not improving consider radiographs, MRI.  If improving would add physical therapy and f/u in 1 month.

## 2014-09-02 ENCOUNTER — Other Ambulatory Visit: Payer: Self-pay | Admitting: *Deleted

## 2014-09-02 ENCOUNTER — Other Ambulatory Visit: Payer: Self-pay | Admitting: Internal Medicine

## 2014-09-02 MED ORDER — MELOXICAM 15 MG PO TABS: ORAL_TABLET | ORAL | Status: AC

## 2014-09-02 NOTE — Telephone Encounter (Signed)
Refill request

## 2014-12-11 ENCOUNTER — Encounter: Payer: Self-pay | Admitting: Internal Medicine

## 2015-10-16 ENCOUNTER — Other Ambulatory Visit (HOSPITAL_COMMUNITY): Payer: Self-pay | Admitting: Nephrology

## 2015-10-16 DIAGNOSIS — N183 Chronic kidney disease, stage 3 unspecified: Secondary | ICD-10-CM

## 2015-10-23 ENCOUNTER — Ambulatory Visit (HOSPITAL_COMMUNITY)
Admission: RE | Admit: 2015-10-23 | Discharge: 2015-10-23 | Disposition: A | Discharge status: Home / Self Care | Payer: Managed Care, Other (non HMO) | Source: Ambulatory Visit | Attending: Nephrology | Admitting: Nephrology

## 2015-10-23 DIAGNOSIS — N183 Chronic kidney disease, stage 3 unspecified: Secondary | ICD-10-CM

## 2016-05-19 ENCOUNTER — Encounter: Payer: Self-pay | Admitting: Family Medicine

## 2016-05-24 ENCOUNTER — Ambulatory Visit (INDEPENDENT_AMBULATORY_CARE_PROVIDER_SITE_OTHER): Payer: Managed Care, Other (non HMO) | Admitting: Family Medicine

## 2016-05-24 ENCOUNTER — Encounter: Payer: Self-pay | Admitting: Family Medicine

## 2016-05-24 DIAGNOSIS — M25571 Pain in right ankle and joints of right foot: Secondary | ICD-10-CM

## 2016-05-24 DIAGNOSIS — M25572 Pain in left ankle and joints of left foot: Secondary | ICD-10-CM | POA: Diagnosis not present

## 2016-05-24 NOTE — Patient Instructions (Signed)
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 I would try conservative measures first. Ice the area for 15 minutes at a time, 3-4 times a day if needed. Aleve 2 tabs twice a day with food OR ibuprofen 3 tabs three times a day with food for pain and inflammation if needed Arch supports (like dr. Jari Sportsmanscholls active series, spencos, or something similar) are important. Avoid flat shoes, barefoot walking as much as possible. Start theraband strengthening exercises when directed - once a day 3 sets of 10. Consider physical therapy for strengthening and balance exercises. If not improving as expected, we may repeat x-rays or consider further testing like an MRI.

## 2016-05-25 DIAGNOSIS — M25571 Pain in right ankle and joints of right foot: Secondary | ICD-10-CM | POA: Insufficient documentation

## 2016-05-25 DIAGNOSIS — M25572 Pain in left ankle and joints of left foot: Secondary | ICD-10-CM

## 2016-05-25 NOTE — Progress Notes (Signed)
PCP: Lacey HedgerEBBIE SCHOENHOFF, MD  Subjective:   HPI: Patient is a 40 y.o. female here for bilateral ankle pain.  Patient reports long history of problems with her ankles. History of several ankle sprains dating back to her early teens. Reports about 2 weeks ago she did a trampoline class and twisted right ankle a little getting off the trampoline Pain lateral ankle, swelling. Feels more posterior in the lateral ankle. Does get occasional feeling like something catches (longstanding) and feels she has to move ankle around to get it loosened back up. Also reports medial ankle pain on the right without injury. Pain 1/10 right ankle, up to 9/10 and sharp. Pain 2/10 on left but up to 8/10 at worst, sharp. No skin changes, numbness.  Past Medical History:  Diagnosis Date  . Depression   . Hallux abducto valgus 06/2011   right  . Hammertoe 06/2011   right 4th  . Hypothyroidism   . 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 Migraine     Current Outpatient Prescriptions on File Prior to Visit  Medication Sig Dispense Refill  . buPROPion (WELLBUTRIN XL) 150 MG 24 hr tablet TAKE 3 TABLETS BY MOUTH EVERY DAY 270 tablet 3  . meloxicam (MOBIC) 15 MG tablet Take one tablet by mouth daily with food 30 tablet 0  . Multiple Vitamins-Minerals (MULTI FOR HER PO) Take 1 tablet by mouth daily.    . zaleplon (SONATA) 5 MG capsule Take 1 capsule (5 mg total) by mouth at bedtime. 30 capsule 0   No current facility-administered medications on file prior to visit.     Past Surgical History:  Procedure Laterality Date  . BREAST ENHANCEMENT SURGERY  1/13  . BUNIONECTOMY  1/13   right  . FOOT SURGERY     bilat.    No Known Allergies  Social History   Social History  . Marital status: Married    Spouse name: N/A  . Number of children: N/A  . Years of education: N/A   Occupational History  . Not on file.   Social History Main Topics  . Smoking status: Never Smoker  . Smokeless tobacco: Never  Used  . Alcohol use 4.2 oz/week    7 Glasses of wine per week     Comment: socially  . Drug use: No  . Sexual activity: Yes    Birth control/ protection: None   Other Topics Concern  . Not on file   Social History Narrative  . No narrative on file    Family History  Problem Relation Age of Onset  . Breast cancer Mother   . Hyperlipidemia Mother   . Colon polyps Mother   . Heart failure Father   . Prostate cancer Father   . Diabetes Father   . Depression Sister   . Depression Brother   . Colon cancer Maternal Grandmother   . Colon polyps Maternal Grandmother   . Hypertension Paternal Grandmother   . Heart failure Paternal Grandfather   . Heart attack Neg Hx     BP 123/79   Pulse 69   Ht 5\' 2"  (1.575 m)   Wt 118 lb (53.5 kg)   BMI 21.58 kg/m   Review of Systems: See HPI above.     Objective:  Physical Exam:  Gen: NAD, comfortable in exam room  Right ankle: Mild lateral swelling.  No bruising other deformity.  Cannot reproduce catching. FROM with 5/5 strength. TTP over peroneal tendons. Negative ant drawer and talar  tilt.   Negative syndesmotic compression. Thompsons test negative. NV intact distally.  Left ankle: No gross deformity, swelling, ecchymoses FROM with 5/5 strength all directions, pain internal rotation. TTP over posterior tibialis tendon. Negative ant drawer and talar tilt.   Negative syndesmotic compression. Thompsons test negative. NV intact distally.  MSK u/s:  No evidence tendon tears.  Noted small calcification near post tib insertion on left ankle - ? Accessory ossicle vs old avulsion fracture.   Assessment & Plan:  1. Bilateral ankle pain - 2/2 peroneal tendinopathy on right, posterior tibialis tendinopathy on left.  Chronically sounds like she also has impingement on right ankle.  She will start with icing, aleve or ibuprofen, arch supports, shown home exercise program to do daily.  Consider custom orthotics, physical therapy, nitro  patches if not improving.

## 2016-05-25 NOTE — Assessment & Plan Note (Signed)
2/2 peroneal tendinopathy on right, posterior tibialis tendinopathy on left.  Chronically sounds like she also has impingement on right ankle.  She will start with icing, aleve or ibuprofen, arch supports, shown home exercise program to do daily.  Consider custom orthotics, physical therapy, nitro patches if not improving.

## 2016-10-27 DIAGNOSIS — F5101 Primary insomnia: Secondary | ICD-10-CM | POA: Insufficient documentation

## 2016-10-27 DIAGNOSIS — R768 Other specified abnormal immunological findings in serum: Secondary | ICD-10-CM | POA: Insufficient documentation

## 2016-10-27 DIAGNOSIS — L409 Psoriasis, unspecified: Secondary | ICD-10-CM | POA: Insufficient documentation

## 2016-10-27 DIAGNOSIS — Z87448 Personal history of other diseases of urinary system: Secondary | ICD-10-CM | POA: Insufficient documentation

## 2016-10-27 DIAGNOSIS — R5383 Other fatigue: Secondary | ICD-10-CM | POA: Insufficient documentation

## 2016-10-27 NOTE — Progress Notes (Signed)
Office Visit Note  Patient: Lacey Murphy             Date of Birth: Jun 11, 1976           MRN: [id number]             PCP: Farris Has, MD Referring: Farris Has, MD Visit Date: 11/01/2016 Occupation: Emergency planning/management officer    Subjective:  New Patient (Initial Visit)   History of Present Illness: Lacey Murphy is a 41 y.o. female seen in consultation per request of her PCP for evaluation of multiple arthralgias and positive ANA. Patient's symptoms started in 2010 with fatigue. In 2011 she was diagnosed with hypothyroidism. Approximately 4 years ago she was found to have elevated creatinine and was referred to Dr. Allena Katz who diagnosed her with chronic renal insufficiency. She has had joint pain for some time and had positive ANA in the past. She had rheumatology evaluation by Dr. Nickola Major in May 2017 at the time autoimmune workup was negative. She was experiencing peeling of her skin and blistering under her palms of her hands and feet. She has history of infrequent oral ulcers and dry mouth. She had taken ibuprofen for several years due to ongoing pain and discomfort. She exercises on regular basis and does CrossFit. In September 2017 patient saw a dermatologist and the biopsy was consistent with psoriasis. She was given a topical therapy which she did not use. She states over time  she noticed changes in her nails. She reports pain and swelling in her bilateral ankle joints. Her right second and left third finger is still swollen. She also reports pain and discomfort in her neck and lower back. Her feet hurt that time. Recently she's been having some discomfort in the right upper quadrant region. She is always tired and is concerned about her fatigue.  Activities of Daily Living:  Patient reports morning stiffness for 30 minutes.   Patient Denies nocturnal pain.  Difficulty dressing/grooming: Denies Difficulty climbing stairs: Denies Difficulty getting out of chair: Denies Difficulty using hands for  taps, buttons, cutlery, and/or writing: Denies   Review of Systems  Constitutional: Positive for fatigue and weight gain. Negative for night sweats, weight loss and weakness.  HENT: Negative for mouth sores, trouble swallowing, trouble swallowing, mouth dryness and nose dryness.   Eyes: Negative for pain, redness, visual disturbance and dryness.  Respiratory: Negative for cough, shortness of breath and difficulty breathing.   Cardiovascular: Negative for chest pain, palpitations, hypertension, irregular heartbeat and swelling in legs/feet.  Gastrointestinal: Negative for blood in stool, constipation and diarrhea.  Endocrine: Negative for increased urination.  Genitourinary: Negative for vaginal dryness.  Musculoskeletal: Positive for arthralgias, joint pain, joint swelling and muscle tenderness. Negative for myalgias, muscle weakness, morning stiffness and myalgias.  Skin: Positive for rash. Negative for color change, hair loss, skin tightness, ulcers and sensitivity to sunlight.  Allergic/Immunologic: Negative for susceptible to infections.  Neurological: Negative for dizziness, memory loss and night sweats.  Hematological: Negative for swollen glands.  Psychiatric/Behavioral: Positive for depressed mood and sleep disturbance. The patient is not nervous/anxious.     PMFS History:  Patient Active Problem List   Diagnosis Date Noted  . Family history of discoid lupus 11/01/2016  . Psoriasis 10/27/2016  . History of renal insufficiency/mild Dr Allena Katz  10/27/2016  . Primary insomnia 10/27/2016  . ANA positive 10/27/2016  . Other fatigue 10/27/2016  . Bilateral ankle pain 05/25/2016  . Low back pain 08/27/2014  . ALT (SGPT) level raised 12/07/2012  .  Palpitations 12/06/2012  . Renal calculus, left 12/06/2012  . Right hip pain 10/11/2012  . Right knee pain 10/11/2012  . Gastritis 04/24/2012  . Right shoulder pain 04/24/2012  . Epigastric abdominal pain 12/19/2011  . Dysthymia  12/19/2011  . Family history of colon cancer 09/02/2011  . Family history of breast cancer 09/02/2011  . Depression   . Migraine   . Hypothyroidism   . Hammertoe 06/29/2011    Past Medical History:  Diagnosis Date  . Autoimmune thyroiditis   . Depression   . Hallux abducto valgus 06/2011   right  . Hammertoe 06/2011   right 4th  . Hypothyroidism   . 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 Migraine   . Psoriasis     Family History  Problem Relation Age of Onset  . Breast cancer Mother   . Hyperlipidemia Mother   . Colon polyps Mother   . Heart failure Father   . Prostate cancer Father   . Diabetes Father   . Depression Sister   . Depression Brother   . Colon cancer Maternal Grandmother   . Colon polyps Maternal Grandmother   . Hypertension Paternal Grandmother   . Heart failure Paternal Grandfather   . Heart attack Neg Hx    Past Surgical History:  Procedure Laterality Date  . BREAST ENHANCEMENT SURGERY  1/13  . BUNIONECTOMY  1/13   right  . FOOT SURGERY     bilat.   Social History   Social History Narrative  . No narrative on file     Objective: Vital Signs: BP 121/73 (BP Location: Left Arm, Patient Position: Sitting, Cuff Size: Small)   Pulse 71   Resp 12   Ht  (1.575 m)   Wt 125 lb (56.7 kg)   LMP  (LMP Unknown)   BMI 22.86 kg/m    Physical Exam  Constitutional: She is oriented to person, place, and time. She appears well-developed and well-nourished.  HENT:  Head: Normocephalic and atraumatic.  Eyes: Conjunctivae and EOM are normal.  Neck: Normal range of motion.  Cardiovascular: Normal rate, regular rhythm, normal heart sounds and intact distal pulses.   Pulmonary/Chest: Effort normal and breath sounds normal.  Abdominal: Soft. Bowel sounds are normal.  Lymphadenopathy:    She has no cervical adenopathy.  Neurological: She is alert and oriented to person, place, and time.  Skin: Skin is warm and dry. Capillary refill takes less than  2 seconds.  Psychiatric: She has a normal mood and affect. Her behavior is normal.  Nursing note and vitals reviewed.    Musculoskeletal Exam: C-spine some stiffness with range of motion thoracic lumbar spine good range of motion she is some midline tenderness in the lower lumbar region. Shoulder joints elbow joints wrist joints MCPs PIPs DIPs with good range of motion. She has some thickening of her right second digit and left third digit DIP joints but no synovitis was noted. Hip joints knee joints ankles MTPs PIPs with good range of motion she has tenderness on palpation over bilateral ankle joints she has some postsurgical changes and bilateral first MTP joints  CDAI Exam: CDAI Homunculus Exam:   Tenderness:  Right hand: 2nd DIP Left hand: 3rd DIP RLE: acetabulofemoral and tibiotalar LLE: tibiotalar  Swelling:  Right hand: 2nd DIP Left hand: 3rd DIP  Joint Counts:  CDAI Tender Joint count: 0 CDAI Swollen Joint count: 0  Global Assessments:  Patient Global Assessment: 4 Provider Global Assessment: 4  CDAI  Calculated Score: 8    Investigation: Findings:  11/10/2015 Dr Zenovia Jordan notes patient has had Positive ANA 1:160, negative ds DNA, mildly low C3 normal C4, negative U/A  11/21/2015 HLB 27 negative  Psoriasis diagnosis per Dermatology   09/09/2016 BMP  Shows Creat 1.11, GFR low 54 otherwise normal;TSH normal 1.52;    Imaging: Xr Cervical Spine 2 Or 3 Views  Result Date: 11/01/2016 C5-6 C6-7 narrowing was noted more prominent at C5-6 level mild facet joint narrowing was noted Impression these findings are consistent with mild is spondylosis of C-spine  Xr Foot 2 Views Left  Result Date: 11/01/2016 Left first MTP narrowing was noted. PIP/DIP narrowing was noted. No erosive changes were noted. X-ray was consistent with mild osteoarthritis.  Xr Foot 2 Views Right  Result Date: 11/01/2016 Right first MTP severe narrowing noted is screw noted in the right first MCP  and the metac first metatarsal tarsal joint all other MTPs were within normal limits PIP/DIP's were within normal limits without any joint space narrowing or erosive changes Postsurgical changes in right first MTP and metatarsotarsal joint was noted.  Xr Hand 2 View Left  Result Date: 11/01/2016 No MCP PIP/DIP narrowing or erosive changes were noted. No intercarpal joint space changes were noted. Impression: Normal x-ray of left hand  Xr Hand 2 View Right  Result Date: 11/01/2016 No MCP PIP/DIP narrowing or erosive changes were noted. No intercarpal joint space narrowing was noted. X-ray was within normal limits.  Xr Pelvis 1-2 Views  Result Date: 11/01/2016 No SI joint sclerosis are narrowing was noted. Hip joints appear normal.   Speciality Comments: No specialty comments available.    Procedures:  No procedures performed Allergies: Patient has no known allergies.   Assessment / Plan:     Visit Diagnoses: Psoriasis - Diagnosed by dermatologist per patient biopsy was positive.  Pain in both hands she has tenderness and thickening of her right second and left third DIP joint. She is morning stiffness lasting for several hours.- Plan: XR Hand 2 View Right, XR Hand 2 View Left. X-ray of her hands was within normal limits  Pain in both feet she had bilateral bunionectomy. She continues to have pain and discomfort in her bilateral feet.- Plan: XR Foot 2 Views Right, XR Foot 2 Views Left. X-ray shows mild osteoarthritic changes especially in the bilateral first MTP joints and postsurgical changes in the MTPs.  Pain, neck - Plan: XR Cervical Spine 2 or 3 views. Mild spondylosis with C5-6 narrowing and facet joint arthropathy was noted.  Chronic midline low back pain without sciatica - Plan: XR Pelvis 1-2 Views. SI joints were within normal limits.  ALT (SGPT) level raised and elevated creatinine. Patient has seen Dr. Allena Katz in the past. Her kidney functions have been stable over the last few  years. She's been having right upper quadrant pain and would like a GI referral  ANA positive - 1:160 May 2017 reported by Dr. Nickola Major  Other fatigue - Plan: CBC with Differential/Platelet, Sedimentation rate, TSH, CK, ANA, Quantiferon tb gold assay (blood), IgG, IgA, IgM, Serum protein electrophoresis with reflex, Anti-DNA antibody, double-stranded, C3 and C4, VITAMIN D 25 Hydroxy (Vit-D Deficiency, Fractures), Hepatitis panel, acute  History of renal insufficiency/mild Dr Allena Katz  - GFR 54  History of kidney stones  Primary insomnia  History of hypothyroidism  History of migraine  History of gastritis  Family history of breast cancer  Family history of colon cancer  Family history of discoid lupus  Orders: Orders Placed This Encounter  Procedures  . XR Hand 2 View Right  . XR Hand 2 View Left  . XR Pelvis 1-2 Views  . XR Foot 2 Views Right  . XR Foot 2 Views Left  . XR Cervical Spine 2 or 3 views  . CBC with Differential/Platelet  . Sedimentation rate  . TSH  . CK  . ANA  . Quantiferon tb gold assay (blood)  . IgG, IgA, IgM  . Serum protein electrophoresis with reflex  . Anti-DNA antibody, double-stranded  . C3 and C4  . VITAMIN D 25 Hydroxy (Vit-D Deficiency, Fractures)  . Hepatitis panel, acute  . Ambulatory referral to Gastroenterology   No orders of the defined types were placed in this encounter.   Face-to-face time spent with patient was 50 minutes. 50% of time was spent in counseling and coordination of care.  Follow-Up Instructions: Return for Psorisis, joint pain.   Pollyann Savoy, MD  Note - This record has been created using Animal nutritionist.  Chart creation errors have been sought, but may not always  have been located. Such creation errors do not reflect on  the standard of medical care.

## 2016-11-01 ENCOUNTER — Other Ambulatory Visit: Payer: Self-pay | Admitting: Rheumatology

## 2016-11-01 ENCOUNTER — Ambulatory Visit (INDEPENDENT_AMBULATORY_CARE_PROVIDER_SITE_OTHER): Payer: 59

## 2016-11-01 ENCOUNTER — Ambulatory Visit (INDEPENDENT_AMBULATORY_CARE_PROVIDER_SITE_OTHER): Payer: 59 | Admitting: Rheumatology

## 2016-11-01 ENCOUNTER — Encounter: Payer: Self-pay | Admitting: Gastroenterology

## 2016-11-01 ENCOUNTER — Encounter: Payer: Self-pay | Admitting: Rheumatology

## 2016-11-01 VITALS — BP 121/73 | HR 71 | Resp 12 | Ht 62.0 in | Wt 125.0 lb

## 2016-11-01 DIAGNOSIS — G8929 Other chronic pain: Secondary | ICD-10-CM

## 2016-11-01 DIAGNOSIS — R768 Other specified abnormal immunological findings in serum: Secondary | ICD-10-CM

## 2016-11-01 DIAGNOSIS — M79671 Pain in right foot: Secondary | ICD-10-CM

## 2016-11-01 DIAGNOSIS — M79641 Pain in right hand: Secondary | ICD-10-CM

## 2016-11-01 DIAGNOSIS — M545 Low back pain, unspecified: Secondary | ICD-10-CM

## 2016-11-01 DIAGNOSIS — L409 Psoriasis, unspecified: Secondary | ICD-10-CM

## 2016-11-01 DIAGNOSIS — M79642 Pain in left hand: Secondary | ICD-10-CM

## 2016-11-01 DIAGNOSIS — R740 Nonspecific elevation of levels of transaminase and lactic acid dehydrogenase [LDH]: Secondary | ICD-10-CM | POA: Diagnosis not present

## 2016-11-01 DIAGNOSIS — Z87448 Personal history of other diseases of urinary system: Secondary | ICD-10-CM

## 2016-11-01 DIAGNOSIS — R1011 Right upper quadrant pain: Secondary | ICD-10-CM

## 2016-11-01 DIAGNOSIS — R5383 Other fatigue: Secondary | ICD-10-CM

## 2016-11-01 DIAGNOSIS — Z803 Family history of malignant neoplasm of breast: Secondary | ICD-10-CM

## 2016-11-01 DIAGNOSIS — F5101 Primary insomnia: Secondary | ICD-10-CM

## 2016-11-01 DIAGNOSIS — M79672 Pain in left foot: Secondary | ICD-10-CM | POA: Diagnosis not present

## 2016-11-01 DIAGNOSIS — Z8719 Personal history of other diseases of the digestive system: Secondary | ICD-10-CM

## 2016-11-01 DIAGNOSIS — Z87442 Personal history of urinary calculi: Secondary | ICD-10-CM

## 2016-11-01 DIAGNOSIS — R7401 Elevation of levels of liver transaminase levels: Secondary | ICD-10-CM

## 2016-11-01 DIAGNOSIS — Z840 Family history of diseases of the skin and subcutaneous tissue: Secondary | ICD-10-CM

## 2016-11-01 DIAGNOSIS — M542 Cervicalgia: Secondary | ICD-10-CM | POA: Diagnosis not present

## 2016-11-01 DIAGNOSIS — Z8639 Personal history of other endocrine, nutritional and metabolic disease: Secondary | ICD-10-CM

## 2016-11-01 DIAGNOSIS — Z8669 Personal history of other diseases of the nervous system and sense organs: Secondary | ICD-10-CM

## 2016-11-01 DIAGNOSIS — Z800 Family history of malignant neoplasm of digestive organs: Secondary | ICD-10-CM

## 2016-11-01 LAB — CBC WITH DIFFERENTIAL/PLATELET
BASOS ABS: 0 {cells}/uL (ref 0–200)
Basophils Relative: 0 %
EOS PCT: 1 %
Eosinophils Absolute: 62 cells/uL (ref 15–500)
HCT: 43.5 % (ref 35.0–45.0)
Hemoglobin: 14.3 g/dL (ref 11.7–15.5)
Lymphocytes Relative: 27 %
Lymphs Abs: 1674 cells/uL (ref 850–3900)
MCH: 29.6 pg (ref 27.0–33.0)
MCHC: 32.9 g/dL (ref 32.0–36.0)
MCV: 90.1 fL (ref 80.0–100.0)
MONOS PCT: 6 %
MPV: 10 fL (ref 7.5–12.5)
Monocytes Absolute: 372 cells/uL (ref 200–950)
NEUTROS ABS: 4092 {cells}/uL (ref 1500–7800)
Neutrophils Relative %: 66 %
PLATELETS: 242 10*3/uL (ref 140–400)
RBC: 4.83 MIL/uL (ref 3.80–5.10)
RDW: 13.5 % (ref 11.0–15.0)
WBC: 6.2 10*3/uL (ref 3.8–10.8)

## 2016-11-01 LAB — TSH: TSH: 1.76 mIU/L

## 2016-11-02 ENCOUNTER — Telehealth: Payer: Self-pay | Admitting: Rheumatology

## 2016-11-02 LAB — SEDIMENTATION RATE: SED RATE: 1 mm/h (ref 0–20)

## 2016-11-02 LAB — IGG, IGA, IGM
IGM, SERUM: 62 mg/dL (ref 48–271)
IgA: 112 mg/dL (ref 81–463)
IgG (Immunoglobin G), Serum: 798 mg/dL (ref 694–1618)

## 2016-11-02 LAB — C3 AND C4
C3 COMPLEMENT: 93 mg/dL (ref 83–193)
C4 COMPLEMENT: 17 mg/dL (ref 15–57)

## 2016-11-02 LAB — HEPATITIS PANEL, ACUTE
HCV Ab: NEGATIVE
HEP B C IGM: NONREACTIVE
HEP B S AG: NEGATIVE
Hep A IgM: NONREACTIVE

## 2016-11-02 LAB — CK: Total CK: 532 U/L — ABNORMAL HIGH (ref 7–177)

## 2016-11-02 LAB — ANA: ANA: POSITIVE — AB

## 2016-11-02 LAB — ANTI-NUCLEAR AB-TITER (ANA TITER)

## 2016-11-02 LAB — ANTI-DNA ANTIBODY, DOUBLE-STRANDED: ds DNA Ab: <1 IU/mL

## 2016-11-02 LAB — VITAMIN D 25 HYDROXY (VIT D DEFICIENCY, FRACTURES): Vit D, 25-Hydroxy: 48 ng/mL (ref 30–100)

## 2016-11-02 NOTE — Telephone Encounter (Signed)
Patient would like to add a Creatine Level to the labs ordered. Please advise.

## 2016-11-02 NOTE — Telephone Encounter (Signed)
[facility] have CMP with GFR added to patient's labs that were drawn at her appointment on 11/01/16.

## 2016-11-02 NOTE — Telephone Encounter (Signed)
Patient was in office for a new patient visit on 11/01/16. Patient would like to know if we could add a CMP to her blood work. Patient states she would like to have her kidney function checked. Can we add this test?

## 2016-11-02 NOTE — Telephone Encounter (Signed)
ok 

## 2016-11-03 LAB — COMPLETE METABOLIC PANEL WITH GFR
ALBUMIN: 4.4 g/dL (ref 3.6–5.1)
ALK PHOS: 38 U/L (ref 33–115)
ALT: 23 U/L (ref 6–29)
AST: 37 U/L — ABNORMAL HIGH (ref 10–30)
BUN: 14 mg/dL (ref 7–25)
CALCIUM: 9.2 mg/dL (ref 8.6–10.2)
CHLORIDE: 107 mmol/L (ref 98–110)
CO2: 24 mmol/L (ref 20–31)
Creat: 1.1 mg/dL (ref 0.50–1.10)
GFR, EST NON AFRICAN AMERICAN: 63 mL/min (ref >=60)
GFR, Est African American: 72 mL/min (ref >=60)
Glucose, Bld: 85 mg/dL (ref 65–99)
POTASSIUM: 4.5 mmol/L (ref 3.5–5.3)
SODIUM: 141 mmol/L (ref 135–146)
Total Bilirubin: 0.6 mg/dL (ref 0.2–1.2)
Total Protein: 6.8 g/dL (ref 6.1–8.1)

## 2016-11-03 LAB — QUANTIFERON TB GOLD ASSAY (BLOOD)
Interferon Gamma Release Assay: NEGATIVE
Quantiferon Nil Value: 0.44 IU/mL

## 2016-11-03 LAB — PROTEIN ELECTROPHORESIS, SERUM, WITH REFLEX
Albumin ELP: 4.2 g/dL (ref 3.8–4.8)
Alpha-1-Globulin: 0.3 g/dL (ref 0.2–0.3)
Alpha-2-Globulin: 0.6 g/dL (ref 0.5–0.9)
BETA GLOBULIN: 0.4 g/dL (ref 0.4–0.6)
Beta 2: 0.2 g/dL (ref 0.2–0.5)
GAMMA GLOBULIN: 0.7 g/dL — AB (ref 0.8–1.7)
TOTAL PROTEIN, SERUM ELECTROPHOR: 6.6 g/dL (ref 6.1–8.1)

## 2016-11-03 NOTE — Progress Notes (Signed)
Add aldolase and myositis assessment panel

## 2016-11-03 NOTE — Progress Notes (Signed)
Stable, will monitor 

## 2016-11-05 ENCOUNTER — Encounter: Payer: Self-pay | Admitting: Rheumatology

## 2016-11-08 ENCOUNTER — Other Ambulatory Visit (INDEPENDENT_AMBULATORY_CARE_PROVIDER_SITE_OTHER): Payer: Managed Care, Other (non HMO)

## 2016-11-08 ENCOUNTER — Encounter: Payer: Self-pay | Admitting: Gastroenterology

## 2016-11-08 ENCOUNTER — Ambulatory Visit (INDEPENDENT_AMBULATORY_CARE_PROVIDER_SITE_OTHER): Payer: Managed Care, Other (non HMO) | Admitting: Gastroenterology

## 2016-11-08 VITALS — BP 98/60 | HR 76 | Ht 62.0 in | Wt 123.4 lb

## 2016-11-08 DIAGNOSIS — R945 Abnormal results of liver function studies: Secondary | ICD-10-CM

## 2016-11-08 DIAGNOSIS — R1011 Right upper quadrant pain: Secondary | ICD-10-CM

## 2016-11-08 DIAGNOSIS — R7989 Other specified abnormal findings of blood chemistry: Secondary | ICD-10-CM

## 2016-11-08 LAB — PROTIME-INR
INR: 0.9 ratio (ref 0.8–1.0)
Prothrombin Time: 10 s (ref 9.6–13.1)

## 2016-11-08 NOTE — Progress Notes (Signed)
History of Present Illness: This is a 41 year old female referred by Bo Merino, MD for the evaluation of mildly elevated LFTs and RUQ pain. AST mildly elevated. ALT was elevated in 2014 and 2015 and it is now normal. She relates 2 months of intermittent sharp right lower chest pain, right upper quadrant pain and epigastric pain. Symptoms are sometimes brought on by deep breaths and by bending over. Symptoms are not related to meals or bowel movements. No prior history of liver disease, jaundice or hepatitis. No family history of liver disease. Denies weight loss, constipation, diarrhea, change in stool caliber, melena, hematochezia, nausea, vomiting, dysphagia, reflux symptoms.   11/01/2016 Positive ANA 1:320 homogeneous. CK=532. CBC normal. TSH normal. ESR=1. AST=37, LFTs otherwise normal. Acute hepatitis panel is negative.   11/10/2015 Positive ANA 1:160, negative ds DNA, mildly low C3 normal C4, negative U/A  11/21/2015 HLB 27 negative  Abd/pelvic CT 11/2011 IMPRESSION: No acute findings. Moderate stool burden. Areas of scarring in the kidneys bilaterally.  The left nephrolithiasis. Mild gallbladder distention without evidence of wall thickening or stones.   No Known Allergies Outpatient Medications Prior to Visit  Medication Sig Dispense Refill  . buPROPion (WELLBUTRIN XL) 150 MG 24 hr tablet TAKE 3 TABLETS BY MOUTH EVERY DAY (Patient taking differently: TAKE 1 TABLETS BY MOUTH EVERY DAY) 270 tablet 3  . Cetirizine HCl (ZYRTEC ALLERGY PO) Take by mouth daily.    Marland Kitchen MARLISSA 0.15-30 MG-MCG tablet Take 1 tablet by mouth daily.     . meloxicam (MOBIC) 15 MG tablet Take one tablet by mouth daily with food (Patient taking differently: as needed. Take one tablet by mouth daily with food) 30 tablet 0  . Multiple Vitamins-Minerals (MULTI FOR HER PO) Take 1 tablet by mouth daily.    Marland Kitchen SYNTHROID 75 MCG tablet Take 75 mcg by mouth daily.     Marland Kitchen NATURE-THROID 130 MG tablet     . zaleplon  (SONATA) 5 MG capsule Take 1 capsule (5 mg total) by mouth at bedtime. (Patient not taking: Reported on 11/01/2016) 30 capsule 0   No facility-administered medications prior to visit.    Past Medical History:  Diagnosis Date  . Arthritis   . Autoimmune thyroiditis   . Chronic kidney disease   . Depression   . Elevated AST (SGOT)   . Hallux abducto valgus 06/2011   right  . Hammertoe 06/2011   right 4th  . Hypothyroidism   . Kidney stones   . 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 Migraine   . Psoriasis    Past Surgical History:  Procedure Laterality Date  . BREAST ENHANCEMENT SURGERY  1/13  . BUNIONECTOMY  1/13   right  . FOOT SURGERY     bilat.   Social History   Social History  . Marital status: Married    Spouse name: N/A  . Number of children: N/A  . Years of education: N/A   Social History Main Topics  . Smoking status: Never Smoker  . Smokeless tobacco: Never Used  . Alcohol use 4.2 oz/week    7 Glasses of wine per week     Comment: socially  . Drug use: No  . Sexual activity: Yes    Birth control/ protection: None   Other Topics Concern  . None   Social History Narrative  . None   Family History  Problem Relation Age of Onset  . Breast cancer Mother   . Hyperlipidemia Mother   .  Colon polyps Mother   . Heart failure Father   . Prostate cancer Father   . Diabetes Father   . Depression Sister   . Depression Brother   . Colon cancer Maternal Grandmother   . Colon polyps Maternal Grandmother   . Hypertension Paternal Grandmother   . Heart failure Paternal Grandfather   . Heart attack Neg Hx        Review of Systems: Pertinent positive and negative review of systems were noted in the above HPI section. All other review of systems were otherwise negative.   Physical Exam: General: Well developed, well nourished, no acute distress Head: Normocephalic and atraumatic Eyes:  sclerae anicteric, EOMI Ears: Normal auditory acuity Mouth: No  deformity or lesions Neck: Supple, no masses or thyromegaly Lungs: Clear throughout to auscultation Heart: Regular rate and rhythm; no murmurs, rubs or bruits Abdomen: Soft, mild epigastric tenderness and non distended. No masses, hepatosplenomegaly or hernias noted. Normal Bowel sounds Rectal: not done Musculoskeletal: Symmetrical with no gross deformities  Skin: No lesions on visible extremities Pulses:  Normal pulses noted Extremities: No clubbing, cyanosis, edema or deformities noted Neurological: Alert oriented x 4, grossly nonfocal Cervical Nodes:  No significant cervical adenopathy Inguinal Nodes: No significant inguinal adenopathy Psychological:  Alert and cooperative. Normal mood and affect  Assessment and Recommendations:  1. R lower chest, RUQ, epigastric pain associated with movement. Suspected musculoskeletal symptoms. Elevated CK being evaluated by Dr. Estanislado Pandy. Rule out cholelithiasis. Schedule abdominal ultrasound.   2. Mildly elevated AST, previously elevated ALT. Send hepatic serologies not already performed. Obtain PT/INR. Abdominal ultrasound as above.   cc: Bo Merino, MD [address] [place], [place] [address]

## 2016-11-08 NOTE — Patient Instructions (Signed)
Your physician has requested that you go to the basement for the following lab work before leaving today.  You have been scheduled for an abdominal ultrasound at [facility] (1st floor of hospital) on 11/10/16 at 8:00am. Please arrive 15 minutes prior to your appointment for registration. Make certain not to have anything to eat or drink 6 hours prior to your appointment. Should you need to reschedule your appointment, please contact radiology at [phone]. This test typically takes about 30 minutes to perform.  Thank you for choosing me and [facility].  Venita LickMalcolm T. Pleas KochStark, Jr., MD., Clementeen GrahamFACG

## 2016-11-09 ENCOUNTER — Telehealth: Payer: Self-pay | Admitting: Rheumatology

## 2016-11-09 DIAGNOSIS — L409 Psoriasis, unspecified: Secondary | ICD-10-CM

## 2016-11-09 DIAGNOSIS — M2550 Pain in unspecified joint: Secondary | ICD-10-CM

## 2016-11-09 LAB — ALDOLASE: Aldolase: 6.2 U/L (ref <=8.1)

## 2016-11-09 NOTE — Telephone Encounter (Signed)
Please have patient come in next week for repeat CK and aldolase

## 2016-11-10 ENCOUNTER — Ambulatory Visit (HOSPITAL_COMMUNITY)
Admission: RE | Admit: 2016-11-10 | Discharge: 2016-11-10 | Disposition: A | Discharge status: Home / Self Care | Payer: Managed Care, Other (non HMO) | Source: Ambulatory Visit | Attending: Gastroenterology | Admitting: Gastroenterology

## 2016-11-10 DIAGNOSIS — R945 Abnormal results of liver function studies: Secondary | ICD-10-CM | POA: Insufficient documentation

## 2016-11-10 DIAGNOSIS — R1011 Right upper quadrant pain: Secondary | ICD-10-CM | POA: Diagnosis not present

## 2016-11-10 DIAGNOSIS — R7989 Other specified abnormal findings of blood chemistry: Secondary | ICD-10-CM

## 2016-11-10 LAB — ALPHA-1-ANTITRYPSIN: A1 ANTITRYPSIN SER: 173 mg/dL (ref 83–199)

## 2016-11-10 LAB — CERULOPLASMIN: Ceruloplasmin: 35 mg/dL (ref 18–53)

## 2016-11-10 LAB — ANTI-MICROSOMAL ANTIBODY LIVER / KIDNEY

## 2016-11-10 NOTE — Telephone Encounter (Signed)
Patient advised we need her to have a CK and Aldolase drawn. Patient will come on 11/15/16 to have this done.

## 2016-11-11 LAB — MITOCHONDRIAL ANTIBODIES

## 2016-11-12 ENCOUNTER — Encounter: Payer: Self-pay | Admitting: Gastroenterology

## 2016-11-15 ENCOUNTER — Other Ambulatory Visit: Payer: Self-pay | Admitting: Radiology

## 2016-11-15 DIAGNOSIS — L409 Psoriasis, unspecified: Secondary | ICD-10-CM

## 2016-11-15 DIAGNOSIS — M2550 Pain in unspecified joint: Secondary | ICD-10-CM

## 2016-11-15 LAB — ANTI-SMOOTH MUSCLE ANTIBODY, IGG: Smooth Muscle Ab: <20 U (ref <20)

## 2016-11-16 LAB — MYOSITIS ASSESSR PLUS JO-1 AUTOABS
EJ AUTOABS: NOT DETECTED
JO-1 AUTOABS: NEGATIVE AI
Ku Autoabs: NOT DETECTED
Mi-2 Autoabs: NOT DETECTED
OJ Autoabs: NOT DETECTED
PL-12 AUTOABS: NOT DETECTED
PL-7 AUTOABS: NOT DETECTED
SRP Autoabs: NOT DETECTED

## 2016-11-16 LAB — CK: Total CK: 420 U/L — ABNORMAL HIGH (ref 29–143)

## 2016-11-16 NOTE — Progress Notes (Signed)
wnl

## 2016-11-17 LAB — ALDOLASE: ALDOLASE: 4.8 U/L (ref <=8.1)

## 2016-11-17 NOTE — Progress Notes (Signed)
[facility]. If not conclusive then she will need muscle bx.

## 2016-11-19 ENCOUNTER — Telehealth: Payer: Self-pay | Admitting: *Deleted

## 2016-11-19 DIAGNOSIS — R748 Abnormal levels of other serum enzymes: Secondary | ICD-10-CM

## 2016-11-19 NOTE — Progress Notes (Signed)
Her CK was elevated twice . Two weeks ago and 4 days ago. If she wants we can repeat CK again in a week. She should stop exercising prior to repeat CK. CK will be abnormal after the EMG. It has to be done prior to EMG.

## 2016-11-19 NOTE — Telephone Encounter (Signed)
-----   Message from Pollyann SavoyShaili Deveshwar, MD sent at 11/17/2016  8:22 PM EDT ----- [facility]. If not conclusive then she will need muscle bx.

## 2016-12-02 ENCOUNTER — Ambulatory Visit: Payer: 59 | Admitting: Rheumatology

## 2016-12-06 NOTE — Progress Notes (Signed)
Office Visit Note  Patient: Lacey Murphy             Date of Birth: 1976/04/19           MRN: [id number]             PCP: Farris Has, MD Referring: Farris Has, MD Visit Date: 12/09/2016 Occupation: @GUAROCC @    Subjective:  Pain in hands   History of Present Illness: Enrika Aguado is a 41 y.o. female with history of psoriasis and osteoarthritis. She states a few weeks ago she started having increased pain and discomfort in her hands to the point she was having difficulty making a fist with the right hand. Once the swelling resolved she started having peeling of the skin in her hands. She has some discomfort in her feet and peeling of the skin. She has swelling in her ankles and discomfort in her left first toe. She decided not to have nerve conduction velocities and EMG test as she believes due to her workout regimen her muscle enzymes are higher.  Activities of Daily Living:  Patient reports morning stiffness for 30 minutes.   Patient Denies nocturnal pain.  Difficulty dressing/grooming: Denies Difficulty climbing stairs: Denies Difficulty getting out of chair: Denies Difficulty using hands for taps, buttons, cutlery, and/or writing: Denies   Review of Systems  Constitutional: Positive for fatigue. Negative for night sweats, weight gain, weight loss and weakness.  HENT: Negative for mouth sores, trouble swallowing, trouble swallowing, mouth dryness and nose dryness.   Eyes: Negative for pain, redness, visual disturbance and dryness.  Respiratory: Negative for cough, shortness of breath and difficulty breathing.   Cardiovascular: Negative for chest pain, palpitations, hypertension, irregular heartbeat and swelling in legs/feet.  Gastrointestinal: Negative for blood in stool, constipation and diarrhea.  Endocrine: Negative for increased urination.  Genitourinary: Negative for vaginal dryness.  Musculoskeletal: Positive for arthralgias, joint pain and morning stiffness. Negative  for joint swelling, myalgias, muscle weakness, muscle tenderness and myalgias.  Skin: Positive for rash. Negative for color change, hair loss, skin tightness, ulcers and sensitivity to sunlight.       Psoriasis on her hands and feet  Allergic/Immunologic: Negative for susceptible to infections.  Neurological: Negative for dizziness, memory loss and night sweats.  Hematological: Negative for swollen glands.  Psychiatric/Behavioral: Positive for depressed mood and sleep disturbance. The patient is nervous/anxious.     PMFS History:  Patient Active Problem List   Diagnosis Date Noted  . Primary osteoarthritis of both hands 12/08/2016  . Primary osteoarthritis of both feet 12/08/2016  . DJD (degenerative joint disease), cervical 12/08/2016  . Family history of discoid lupus 11/01/2016  . Psoriasis 10/27/2016  . History of renal insufficiency/mild Dr Allena Katz  10/27/2016  . Primary insomnia 10/27/2016  . ANA positive 10/27/2016  . Other fatigue 10/27/2016  . Bilateral ankle pain 05/25/2016  . Low back pain 08/27/2014  . ALT (SGPT) level raised 12/07/2012  . Palpitations 12/06/2012  . Renal calculus, left 12/06/2012  . Right hip pain 10/11/2012  . Right knee pain 10/11/2012  . Gastritis 04/24/2012  . Right shoulder pain 04/24/2012  . Epigastric abdominal pain 12/19/2011  . Dysthymia 12/19/2011  . Family history of colon cancer 09/02/2011  . Family history of breast cancer 09/02/2011  . Depression   . Migraine   . Hypothyroidism   . Hammertoe 06/29/2011    Past Medical History:  Diagnosis Date  . Arthritis   . Autoimmune thyroiditis   . Chronic kidney disease   .  Depression   . Elevated AST (SGOT)   . Hallux abducto valgus 06/2011   right  . Hammertoe 06/2011   right 4th  . Hypothyroidism   . Kidney stones   . 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 Migraine   . Psoriasis     Family History  Problem Relation Age of Onset  . Breast cancer Mother   . Hyperlipidemia  Mother   . Colon polyps Mother   . Heart failure Father   . Prostate cancer Father   . Diabetes Father   . Depression Sister   . Depression Brother   . Colon cancer Maternal Grandmother   . Colon polyps Maternal Grandmother   . Hypertension Paternal Grandmother   . Heart failure Paternal Grandfather   . Heart attack Neg Hx    Past Surgical History:  Procedure Laterality Date  . BREAST ENHANCEMENT SURGERY  1/13  . BUNIONECTOMY  1/13   right  . FOOT SURGERY     bilat.   Social History   Social History Narrative  . No narrative on file     Objective: Vital Signs: BP (!) 98/58   Pulse 78   Resp 14   Ht 5\' 2"  (1.575 m)   Wt 120 lb (54.4 kg)   BMI 21.95 kg/m    Physical Exam  Constitutional: She is oriented to person, place, and time. She appears well-developed and well-nourished.  HENT:  Head: Normocephalic and atraumatic.  Eyes: Conjunctivae and EOM are normal.  Neck: Normal range of motion.  Cardiovascular: Normal rate, regular rhythm, normal heart sounds and intact distal pulses.   Pulmonary/Chest: Effort normal and breath sounds normal.  Abdominal: Soft. Bowel sounds are normal.  Lymphadenopathy:    She has no cervical adenopathy.  Neurological: She is alert and oriented to person, place, and time.  Skin: Skin is warm and dry. Capillary refill takes less than 2 seconds.  Peeling of the skin noted on her palmar and plantar surface  Psychiatric: She has a normal mood and affect. Her behavior is normal.  Nursing note and vitals reviewed.    Musculoskeletal Exam: C-spine and thoracic lumbar spine good range of motion. No SI joint tenderness. She is some discomfort range of motion of her lumbar spine. Shoulder joints elbow joints wrist joints with range of motion. She is thickening of DIP joints bilateral hands with no obvious synovitis. Hip joints knee joints ankles MTPs PIPs with good range of motion. She surgical scar over bilateral first MTP joint. She is some  tenderness on palpation of her left first MTP joint.  CDAI Exam: No CDAI exam completed.    Investigation: Findings:  11/10/2015 Dr Zenovia Jordan notes patient has had Positive ANA 1:160, negative ds DNA, mildly low C3 normal C4, negative U/A  11/21/2015 HLB 27 negative  Psoriasis diagnosis per Dermatology   09/09/2016 BMP  Shows Creat 1.11, GFR low 54 otherwise normal;TSH normal 1.52  ANA positive homogeneous 1:320 titer      11/01/2016 CK elevated 532, Myositis panel negative, Aldolase normal, Serum protein electrophoresis with reflex One or more serum protein fractions are outside the normal ranges. No abnormal protein bands are apparent, ds DNA negative, C3 and C4 normal, CBC normal,Hepatitis panel acute negative, IgG, IgA, IgM normal,TB gold negative, Sedimentation rate normal,TSH normal, Vit D 25 Hydroxy normal,and CMP normal except for elevated AST 37    Imaging: US Abdomen Complete  Result Date: 11/10/2016 CLINICAL DATA:  Right upper quadrant pain.  Abnormal LFTs. EXAM: ABDOMEN ULTRASOUND COMPLETE COMPARISON:  Ultrasound 10/23/2015.  CT 12/16/2011. FINDINGS: Gallbladder: No gallstones or wall thickening visualized. No sonographic Murphy sign noted by sonographer. Common bile duct: Diameter: 4.0 mm Liver: No focal lesion identified. Within normal limits in parenchymal echogenicity. IVC: No abnormality visualized. Pancreas: Visualized portion unremarkable. Spleen: Size and appearance within normal limits. Right Kidney: Length: 10.7 cm. Echogenicity within normal limits. No mass or hydronephrosis visualized. Left Kidney: Length: 10.2 cm. Echogenicity within normal limits. No mass or hydronephrosis visualized. Abdominal aorta: No aneurysm visualized. Other findings: None. IMPRESSION: No acute or focal abnormality. Electronically Signed   By: Maisie Fushomas  Register   On: 11/10/2016 09:04   [place] Extrem Up Bilat Comp  Result Date: 12/09/2016 Ultrasound examination of bilateral hands was performed  per EULAR recommendations. Using 12 MHz transducer, grayscale and power Doppler bilateral second, third, and fifth MCP joints, PIP and DIP joints of bilateral second third and fifth fingers and bilateral wrist joints both dorsal and volar aspects were evaluated to look for synovitis or tenosynovitis. The findings were there was no synovitis or tenosynovitis on ultrasound examination. Right median nerve was 0.08 cm squares which was within normal limits and left median nerve was 0.08 cm squares which was within normal limits. Impression: Ultrasound examination of bilateral hands did not show any inflammatory arthritis. It showed narrowing of the DIP joints consistent with osteoarthritis   Speciality Comments: No specialty comments available.    Procedures:  No procedures performed Allergies: Patient has no known allergies.   Assessment / Plan:     Visit Diagnoses: Psoriasis - Positive biopsy by dermatologist. Patient continues to have some peeling of the skin on her palmar and plantar surface. She's interested in starting on otezla. Indications side effects contraindications of otezla were discussed at length. I have encouraged her to see a psychiatrist and get treated for depression before considering otezla.  Elevated CK - 11/01/2016 CK 532, aldolase normal, myositis panel negative, EMG and nerve conduction velocity pending . Patient states that she does not want to proceed with EMG or nerve conduction velocities. She believes her CK elevation is related to that her exercise. She does vigorous workout everyday with weightlifting. We had detailed discussion regarding that  I advised her to discontinue lifting weights for about a month and then get repeat CK. If her CKs is still elevated we may consider MRI of her lower extremity to look for myositis. She was in agreement. Placed an order for repeat CK in one month.  Pain in both hands - Plan: US Extrem Up Bilat Comp: Ultrasound today did not reveal  any synovitis or tenosynovitis. She did have some osteoarthritic changes in her DIP joints.   Primary osteoarthritis of both hands  Primary osteoarthritis of both feet: Chronic pain   DJD (degenerative joint disease), cervical: Some stiffness   Chronic midline low back pain without sciatica: She has chronic lower back pain   ANA positive - ANA 1:320, DsDNA negative, C3-C4 normal: She has no other clinical features of autoimmune disease. She complains of fatigue.   Family history of discoid lupus  History of renal insufficiency/mild Dr Allena KatzPatel : Patient believes that it was related to Wellbutrin.  Depression: She was quite tearful during the conversation today. She believes her depression is getting worse on the lower dose of Wellbutrin. I've advised her to establish with a psychiatrist.  History of kidney stones  Primary insomnia  Elevated LFTs - AST 37  History of migraine  History  of gastritis    Orders: Orders Placed This Encounter  Procedures  . [place] Extrem Up Bilat Comp  . CK   No orders of the defined types were placed in this encounter.   Face-to-face time spent with patient was 40 minutes. 50% of time was spent in counseling and coordination of care.  Follow-Up Instructions: Return in about 2 months (around 02/08/2017).   Pollyann Savoy, MD  Note - This record has been created using Animal nutritionist.  Chart creation errors have been sought, but may not always  have been located. Such creation errors do not reflect on  the standard of medical care.

## 2016-12-08 DIAGNOSIS — M19072 Primary osteoarthritis, left ankle and foot: Secondary | ICD-10-CM

## 2016-12-08 DIAGNOSIS — M19042 Primary osteoarthritis, left hand: Secondary | ICD-10-CM

## 2016-12-08 DIAGNOSIS — M19041 Primary osteoarthritis, right hand: Secondary | ICD-10-CM | POA: Insufficient documentation

## 2016-12-08 DIAGNOSIS — M19071 Primary osteoarthritis, right ankle and foot: Secondary | ICD-10-CM | POA: Insufficient documentation

## 2016-12-08 DIAGNOSIS — M47812 Spondylosis without myelopathy or radiculopathy, cervical region: Secondary | ICD-10-CM | POA: Insufficient documentation

## 2016-12-09 ENCOUNTER — Encounter: Payer: Self-pay | Admitting: Rheumatology

## 2016-12-09 ENCOUNTER — Ambulatory Visit (INDEPENDENT_AMBULATORY_CARE_PROVIDER_SITE_OTHER): Payer: Managed Care, Other (non HMO) | Admitting: Rheumatology

## 2016-12-09 ENCOUNTER — Inpatient Hospital Stay (INDEPENDENT_AMBULATORY_CARE_PROVIDER_SITE_OTHER): Payer: Self-pay

## 2016-12-09 VITALS — BP 98/58 | HR 78 | Resp 14 | Ht 62.0 in | Wt 120.0 lb

## 2016-12-09 DIAGNOSIS — L409 Psoriasis, unspecified: Secondary | ICD-10-CM

## 2016-12-09 DIAGNOSIS — Z840 Family history of diseases of the skin and subcutaneous tissue: Secondary | ICD-10-CM

## 2016-12-09 DIAGNOSIS — G8929 Other chronic pain: Secondary | ICD-10-CM

## 2016-12-09 DIAGNOSIS — Z87442 Personal history of urinary calculi: Secondary | ICD-10-CM

## 2016-12-09 DIAGNOSIS — M19042 Primary osteoarthritis, left hand: Secondary | ICD-10-CM

## 2016-12-09 DIAGNOSIS — M79641 Pain in right hand: Secondary | ICD-10-CM

## 2016-12-09 DIAGNOSIS — R945 Abnormal results of liver function studies: Secondary | ICD-10-CM

## 2016-12-09 DIAGNOSIS — R748 Abnormal levels of other serum enzymes: Secondary | ICD-10-CM | POA: Diagnosis not present

## 2016-12-09 DIAGNOSIS — M545 Low back pain: Secondary | ICD-10-CM | POA: Diagnosis not present

## 2016-12-09 DIAGNOSIS — M19041 Primary osteoarthritis, right hand: Secondary | ICD-10-CM

## 2016-12-09 DIAGNOSIS — Z87448 Personal history of other diseases of urinary system: Secondary | ICD-10-CM

## 2016-12-09 DIAGNOSIS — M19072 Primary osteoarthritis, left ankle and foot: Secondary | ICD-10-CM

## 2016-12-09 DIAGNOSIS — F5101 Primary insomnia: Secondary | ICD-10-CM | POA: Diagnosis not present

## 2016-12-09 DIAGNOSIS — M19071 Primary osteoarthritis, right ankle and foot: Secondary | ICD-10-CM

## 2016-12-09 DIAGNOSIS — R7689 Other specified abnormal immunological findings in serum: Secondary | ICD-10-CM

## 2016-12-09 DIAGNOSIS — R7989 Other specified abnormal findings of blood chemistry: Secondary | ICD-10-CM | POA: Diagnosis not present

## 2016-12-09 DIAGNOSIS — Z8719 Personal history of other diseases of the digestive system: Secondary | ICD-10-CM

## 2016-12-09 DIAGNOSIS — Z8659 Personal history of other mental and behavioral disorders: Secondary | ICD-10-CM

## 2016-12-09 DIAGNOSIS — M5030 Other cervical disc degeneration, unspecified cervical region: Secondary | ICD-10-CM

## 2016-12-09 DIAGNOSIS — R768 Other specified abnormal immunological findings in serum: Secondary | ICD-10-CM

## 2016-12-09 DIAGNOSIS — M79642 Pain in left hand: Secondary | ICD-10-CM

## 2016-12-09 DIAGNOSIS — Z8669 Personal history of other diseases of the nervous system and sense organs: Secondary | ICD-10-CM

## 2016-12-09 DIAGNOSIS — M47812 Spondylosis without myelopathy or radiculopathy, cervical region: Secondary | ICD-10-CM

## 2016-12-09 NOTE — Patient Instructions (Addendum)
Please return to get lab work in one month (CK)   Apremilast oral tablets What is this medicine? APREMILAST (a PRE mil ast) is used to treat plaque psoriasis and psoriatic arthritis. This medicine may be used for other purposes; ask your health care provider or pharmacist if you have questions. COMMON BRAND NAME(S): Henderson Baltimore What should I tell my health care provider before I take this medicine? They need to know if you have any of these conditions: -dehydration -kidney disease -mental illness -an unusual or allergic reaction to apremilast, other medicines, foods, dyes, or preservatives -pregnant or trying to get pregnant -breast-feeding How should I use this medicine? Take this medicine by mouth with a glass of water. Follow the directions on the prescription label. Do not cut, crush or chew this medicine. You can take it with or without food. If it upsets your stomach, take it with food. Take your medicine at regular intervals. Do not take it more often than directed. Do not stop taking except on your doctor's advice. Talk to your pediatrician regarding the use of this medicine in children. Special care may be needed. Overdosage: If you think you have taken too much of this medicine contact a poison control center or emergency room at once. NOTE: This medicine is only for you. Do not share this medicine with others. What if I miss a dose? If you miss a dose, take it as soon as you can. If it is almost time for your next dose, take only that dose. Do not take double or extra doses. What may interact with this medicine? This medicine may interact with the following medications: -certain medicines for seizures like carbamazepine, phenobarbital, phenytoin -rifampin This list may not describe all possible interactions. Give your health care provider a list of all the medicines, herbs, non-prescription drugs, or dietary supplements you use. Also tell them if you smoke, drink alcohol, or use  illegal drugs. Some items may interact with your medicine. What should I watch for while using this medicine? Tell your doctor or healthcare professional if your symptoms do not start to get better or if they get worse. Patients and their families should watch out for new or worsening depression or thoughts of suicide. Also watch out for sudden changes in feelings such as feeling anxious, agitated, panicky, irritable, hostile, aggressive, impulsive, severely restless, overly excited and hyperactive, or not being able to sleep. If this happens, call your health care professional. Check with your doctor or health care professional if you get an attack of severe diarrhea, nausea and vomiting, or if you sweat a lot. The loss of too much body fluid can make it dangerous for you to take this medicine. What side effects may I notice from receiving this medicine? Side effects that you should report to your doctor or health care professional as soon as possible: -depressed mood -weight loss Side effects that usually do not require medical attention (report to your doctor or health care professional if they continue or are bothersome): -diarrhea -headache -nausea, vomiting This list may not describe all possible side effects. Call your doctor for medical advice about side effects. You may report side effects to FDA at 1-800-FDA-1088. Where should I keep my medicine? Keep out of the reach of children. Store below 30 degrees C (86 degrees F). Throw away any unused medicine after the expiration date. NOTE: This sheet is a summary. It may not cover all possible information. If you have questions about this medicine, talk to your  doctor, pharmacist, or health care provider.  2018 Elsevier/Gold Standard (2015-12-31 10:55:44)

## 2016-12-22 ENCOUNTER — Ambulatory Visit: Payer: 59 | Admitting: Rheumatology

## 2017-01-18 ENCOUNTER — Telehealth: Payer: Self-pay | Admitting: Rheumatology

## 2017-01-18 NOTE — Telephone Encounter (Signed)
Patient calling in reference to Creatine, and CK value. Patient states GP checked it, and its in normal range. Patient needs [place] RX. Patient has started on antidepressant. [facility] [address] [facility] [place].

## 2017-01-19 NOTE — Telephone Encounter (Signed)
Patient advised we will need a copy of labs for her chart. She will have the GP send a copy of her labs to the office. Patient also states she would like to start the [place]. Patient has not signed the consent form for [facility], [place]. Patient will come by the office to sign. Patient states she was recently started on Trintellix.

## 2017-01-20 ENCOUNTER — Telehealth: Payer: Self-pay

## 2017-01-20 NOTE — Telephone Encounter (Signed)
A prior authorization for Henderson BaltimoreOtezla has been submitted to patient's insurance via cover my meds.  Will update once we receive a response.  Ilian Wessell, [place], CPhT 1:23 PM

## 2017-01-20 NOTE — Telephone Encounter (Signed)
Please see above message!

## 2017-01-20 NOTE — Telephone Encounter (Signed)
Pharmacy Note  Subjective: I was asked to call this patient to discuss Otezla.    Objective: CMP     Component Value Date/Time   NA 141 11/01/2016 0945   K 4.5 11/01/2016 0945   CL 107 11/01/2016 0945   CO2 24 11/01/2016 0945   GLUCOSE 85 11/01/2016 0945   BUN 14 11/01/2016 0945   CREATININE 1.10 11/01/2016 0945   CALCIUM 9.2 11/01/2016 0945   PROT 6.8 11/01/2016 0945   ALBUMIN 4.4 11/01/2016 0945   AST 37 (H) 11/01/2016 0945   ALT 23 11/01/2016 0945   ALKPHOS 38 11/01/2016 0945   BILITOT 0.6 11/01/2016 0945   GFRNONAA 63 11/01/2016 0945   GFRAA 72 11/01/2016 0945   Assessment/Plan:  Counseled patient that Henderson BaltimoreOtezla is a PDE 4 inhibitor that works to treat psoriasis.  Counseled patient on purpose, proper use, and adverse effects of Otezla.  Reviewed the most common adverse effects of weight loss, depression, nausea/diarrhea/vomiting, headaches, and nasal congestion.  Patient reports her depression is better controlled now on Trintellix.  Advised patient we can submit prior approval for Henderson BaltimoreOtezla to see if it is covered by her insurance, but we would need consent on Otezla before starting the medication.  Patient voiced understanding.   Patient provided me with her prescription insurance information:  ID: [id number] RxGRP: [id number] RxBIN: [id number] PCN: [id number]  Lilla Shookachel Henderson, Pharm.D., BCPS, CPP Clinical Pharmacist Pager: [phone] Phone: [phone] 01/20/2017 10:13 AM

## 2017-01-21 NOTE — Telephone Encounter (Signed)
Received a fax from [facility] regarding a prior authorization DENIAL for Lacey BaltimoreOtezla because the first preferred product for the patients' plan is Humira. The other preferred products are Stelara and Taltz for patients who have tried and failed Humira.   Reference [id number]:[id number]  Will send document to scan center.  Eevee Borbon, [place], CPhT  10:19 AM

## 2017-01-24 NOTE — Telephone Encounter (Signed)
I informed patient that Lacey Murphy was denied by her insurance because the preferred product for the patient's plan is Lacey Murphy.  Patient reports she is not interested in Lacey Murphy at this time due to the concern about immune suppression and side effects.  She plans to reach out to her insurance to see if they will consider coverage of Lacey Murphy.  Patient denies any further questions or concerns at this time.   Lacey Murphy, Pharm.D., BCPS, CPP Clinical Pharmacist Pager: [phone] Phone: [phone] 01/24/2017 1:08 PM

## 2017-02-01 ENCOUNTER — Telehealth: Payer: Self-pay | Admitting: Rheumatology

## 2017-02-01 NOTE — Telephone Encounter (Signed)
Patient was seen 12/09/16 and Henderson BaltimoreOtezla was being considered.  We applied for Henderson BaltimoreOtezla but it was denied any Humira is first line.  Patient is now stating she would like to start Humira.  Please advise.

## 2017-02-01 NOTE — Telephone Encounter (Signed)
Patient calling back to let you know she would like to start Humira. Please call patient back to discuss.

## 2017-02-01 NOTE — Telephone Encounter (Signed)
Made patient aware of the recommendation.  She will discuss with her dermatologist.    Lilla Shookachel Henderson, Pharm.D., BCPS, CPP Clinical Pharmacist Pager: [phone] Phone: [phone] 02/01/2017 2:07 PM

## 2017-02-01 NOTE — Telephone Encounter (Signed)
Patient clinically has only psoriasis has not psoriatic arthritis. She will have to get prescription for Humira from her dermatologist.

## 2017-02-15 ENCOUNTER — Ambulatory Visit: Payer: Managed Care, Other (non HMO) | Admitting: Rheumatology

## 2017-02-22 ENCOUNTER — Telehealth: Payer: Self-pay | Admitting: *Deleted

## 2017-02-22 NOTE — Telephone Encounter (Signed)
Results received from PCP drawn on 01/14/17.  CMP shows Creat 1.06 GFR 57  CPK 120

## 2017-03-25 DIAGNOSIS — R748 Abnormal levels of other serum enzymes: Secondary | ICD-10-CM | POA: Insufficient documentation

## 2017-03-25 NOTE — Progress Notes (Deleted)
Office Visit Note  Patient: Lacey Murphy             Date of Birth: 1975-07-30           MRN: [id number]             PCP: Farris Has, MD Referring: Farris Has, MD Visit Date: 04/01/2017 Occupation: @    Subjective:  No chief complaint on file.   History of Present Illness: Lacey Murphy is a 41 y.o. female ***   Activities of Daily Living:  Patient reports morning stiffness for *** {minute/hour:19697}.   Patient {ACTIONS;DENIES/REPORTS:21021675::"Denies"} nocturnal pain.  Difficulty dressing/grooming: {ACTIONS;DENIES/REPORTS:21021675::"Denies"} Difficulty climbing stairs: {ACTIONS;DENIES/REPORTS:21021675::"Denies"} Difficulty getting out of chair: {ACTIONS;DENIES/REPORTS:21021675::"Denies"} Difficulty using hands for taps, buttons, cutlery, and/or writing: {ACTIONS;DENIES/REPORTS:21021675::"Denies"}   No Rheumatology ROS completed.   PMFS History:  Patient Active Problem List   Diagnosis Date Noted  . Elevated CK 03/25/2017  . Primary osteoarthritis of both hands 12/08/2016  . Primary osteoarthritis of both feet 12/08/2016  . DJD (degenerative joint disease), cervical 12/08/2016  . Family history of discoid lupus 11/01/2016  . Psoriasis 10/27/2016  . History of renal insufficiency/mild Dr Allena Katz  10/27/2016  . Primary insomnia 10/27/2016  . ANA positive 10/27/2016  . Other fatigue 10/27/2016  . Bilateral ankle pain 05/25/2016  . Low back pain 08/27/2014  . ALT (SGPT) level raised 12/07/2012  . Palpitations 12/06/2012  . Renal calculus, left 12/06/2012  . Right hip pain 10/11/2012  . Right knee pain 10/11/2012  . Gastritis 04/24/2012  . Right shoulder pain 04/24/2012  . Epigastric abdominal pain 12/19/2011  . Dysthymia 12/19/2011  . Family history of colon cancer 09/02/2011  . Family history of breast cancer 09/02/2011  . Depression   . Migraine   . Hypothyroidism   . Hammertoe 06/29/2011    Past Medical History:  Diagnosis Date  . Arthritis   .  Autoimmune thyroiditis   . Chronic kidney disease   . Depression   . Elevated AST (SGOT)   . Hallux abducto valgus 06/2011   right  . Hammertoe 06/2011   right 4th  . Hypothyroidism   . Kidney stones   . 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 Migraine   . Psoriasis     Family History  Problem Relation Age of Onset  . Breast cancer Mother   . Hyperlipidemia Mother   . Colon polyps Mother   . Heart failure Father   . Prostate cancer Father   . Diabetes Father   . Depression Sister   . Depression Brother   . Colon cancer Maternal Grandmother   . Colon polyps Maternal Grandmother   . Hypertension Paternal Grandmother   . Heart failure Paternal Grandfather   . Heart attack Neg Hx    Past Surgical History:  Procedure Laterality Date  . BREAST ENHANCEMENT SURGERY  1/13  . BUNIONECTOMY  1/13   right  . FOOT SURGERY     bilat.   Social History   Social History Narrative  . No narrative on file     Objective: Vital Signs: There were no vitals taken for this visit.   Physical Exam   Musculoskeletal Exam: ***  CDAI Exam: No CDAI exam completed.    Investigation: Findings:  01/14/2017 CK 120  CBC Latest Ref Rng & Units 11/01/2016 03/05/2014 12/06/2012  WBC 3.8 - 10.8 K/uL 6.2 8.3 9.8  Hemoglobin 11.7 - 15.5 g/dL 40.9 81.1 91.4  Hematocrit 35.0 - 45.0 % 43.5 40.5 39.9  Platelets 140 - 400 K/uL 242 231 227   CMP Latest Ref Rng & Units 11/01/2016 03/05/2014 12/06/2012  Glucose 65 - 99 mg/dL 85 161(W) 84  BUN 7 - 25 mg/dL Creatinine 0.50 - 1.10 mg/dL 9.60 4.54 0.98  Sodium 135 - 146 mmol/L 141 139 137  Potassium 3.5 - 5.3 mmol/L 4.5 3.7 3.9  Chloride 98 - 110 mmol/L 107 102 102  CO2 20 - 31 mmol/L Calcium 8.6 - 10.2 mg/dL 9.2 9.3 8.7  Total Protein 6.1 - 8.1 g/dL 6.8 6.3 6.0  Total Bilirubin 0.2 - 1.2 mg/dL 0.6 0.4 0.6  Alkaline Phos 33 - 115 U/L 38 63 61  AST 10 - 30 U/L 37(H) 34 30  ALT 6 - 29 U/L 23 32 38(H)    Imaging: No results  found.  Speciality Comments: No specialty comments available.    Procedures:  No procedures performed Allergies: Patient has no known allergies.   Assessment / Plan:     Visit Diagnoses: ANA positive - homogeneous 1:320 titer    Elevated CK - 532,Myositis panel normal/ Aldolase normal   Psoriasis  Other fatigue  DJD (degenerative joint disease), cervical  Primary osteoarthritis of both hands  Primary osteoarthritis of both feet  ALT (SGPT) level raised  History of renal insufficiency/mild Dr Allena Katz   Family history of discoid lupus  Primary insomnia  History of migraine  Family history of colon cancer  Family history of breast cancer  History of palpitations    Orders: No orders of the defined types were placed in this encounter.  No orders of the defined types were placed in this encounter.   Face-to-face time spent with patient was *** minutes. 50% of time was spent in counseling and coordination of care.  Follow-Up Instructions: No Follow-up on file.   Mckennon Zwart, RT  Note - This record has been created using [employer].  Chart creation errors have been sought, but may not always  have been located. Such creation errors do not reflect on  the standard of medical care.

## 2017-04-01 ENCOUNTER — Ambulatory Visit: Payer: Managed Care, Other (non HMO) | Admitting: Rheumatology

## 2017-08-01 IMAGING — US US ABDOMEN COMPLETE
1 series · 14 of 25 positions shown · non-contrast
Comparison: Ultrasound 10/23/2015.  CT 12/16/2011.

CLINICAL DATA: Right upper quadrant pain.  Abnormal LFTs.

EXAM:
ABDOMEN ULTRASOUND COMPLETE

[Series 1: us abdomen complete · 0.15mm/px · 14 of 114 slices shown]
[im 1/114]
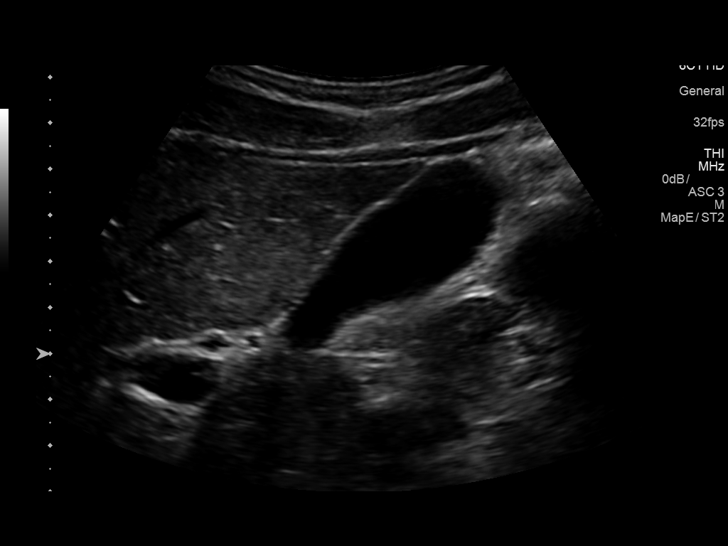
[im 10/114]
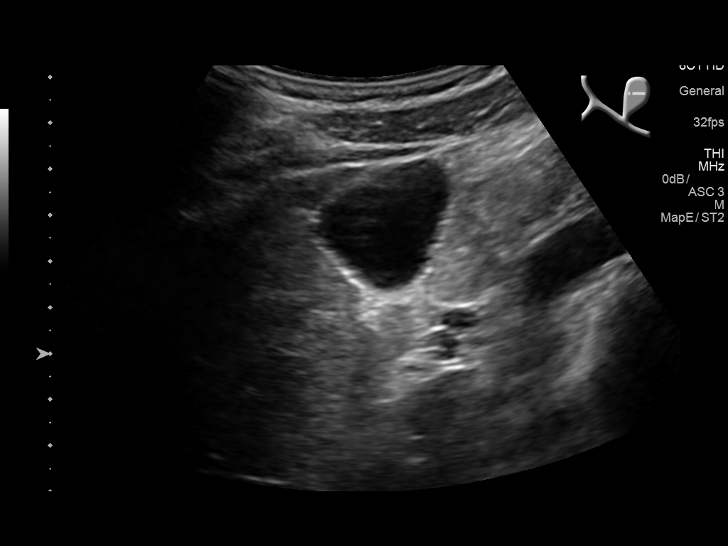
[im 19/114]
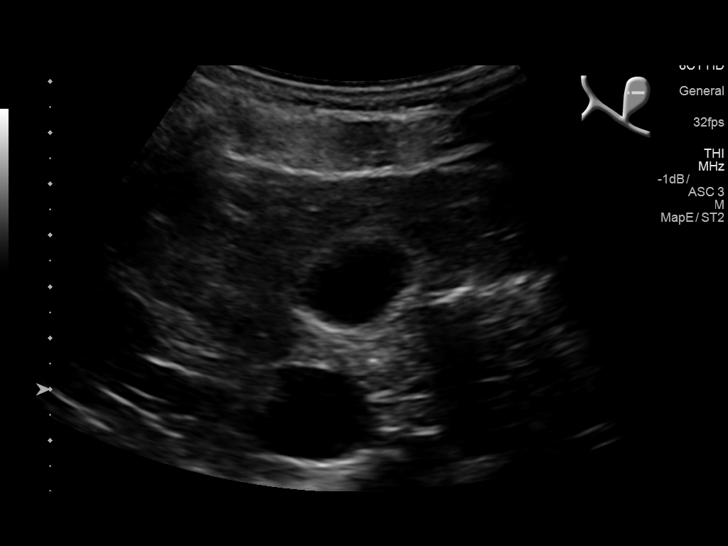
[im 29/114]
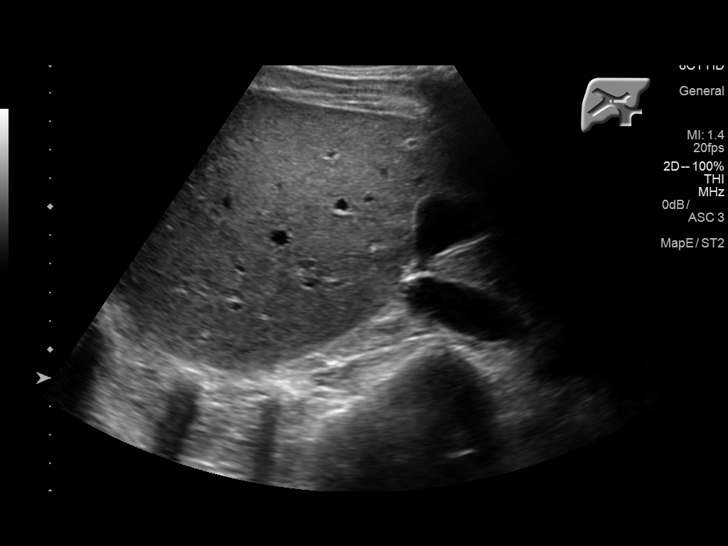
[im 38/114]
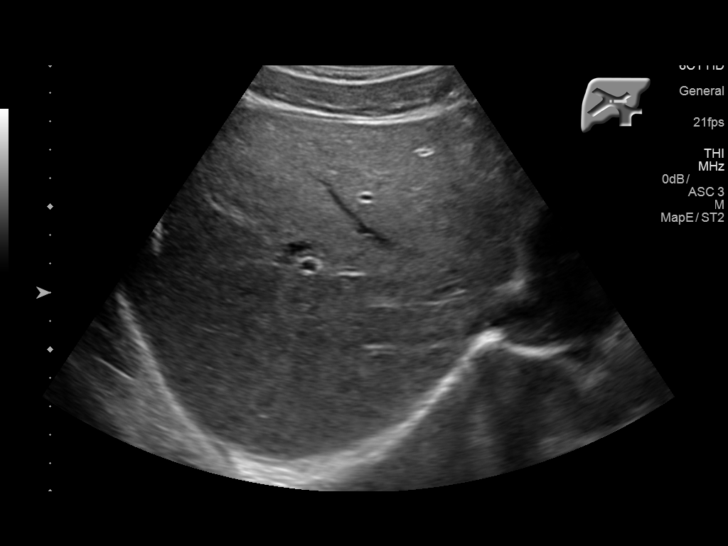
[im 43/114]
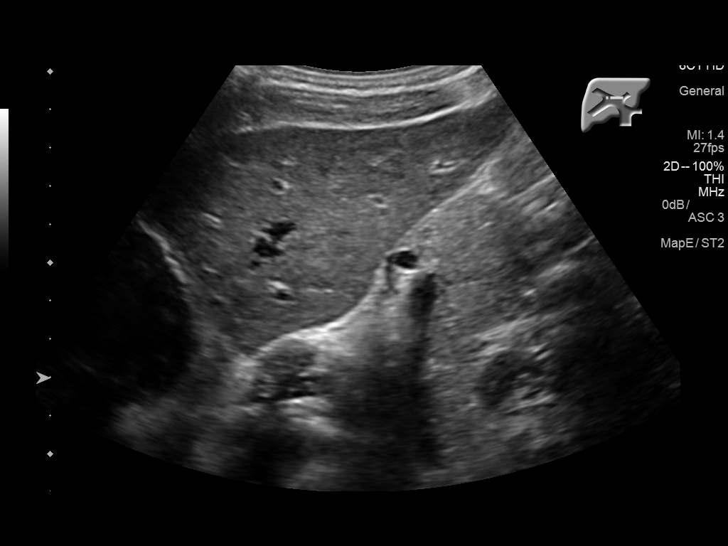
[im 52/114]
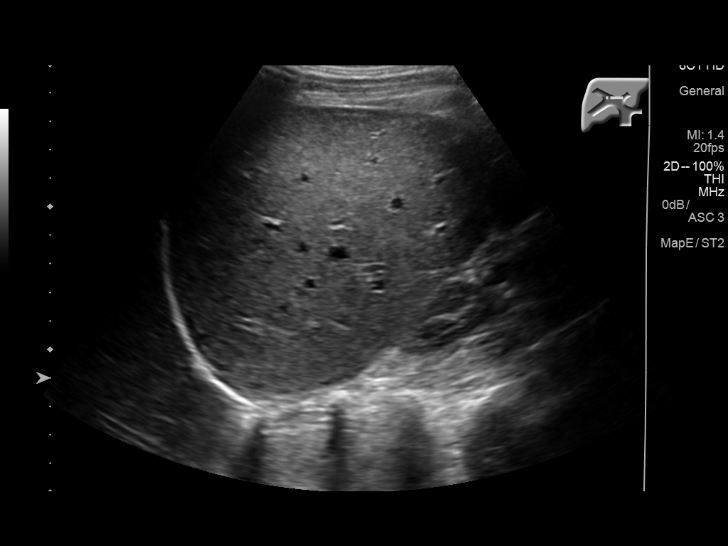
[im 62/114]
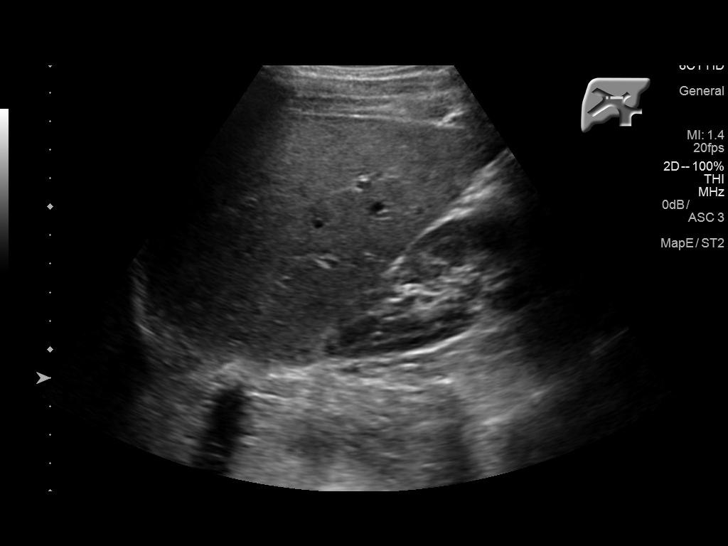
[im 71/114]
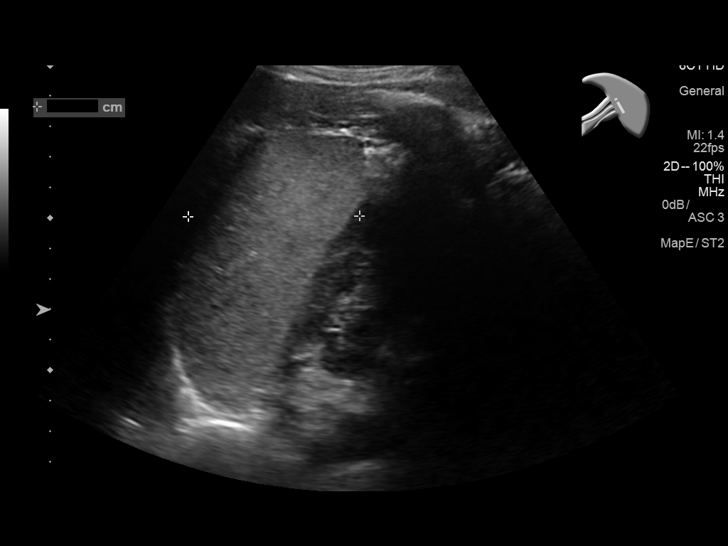
[im 76/114]
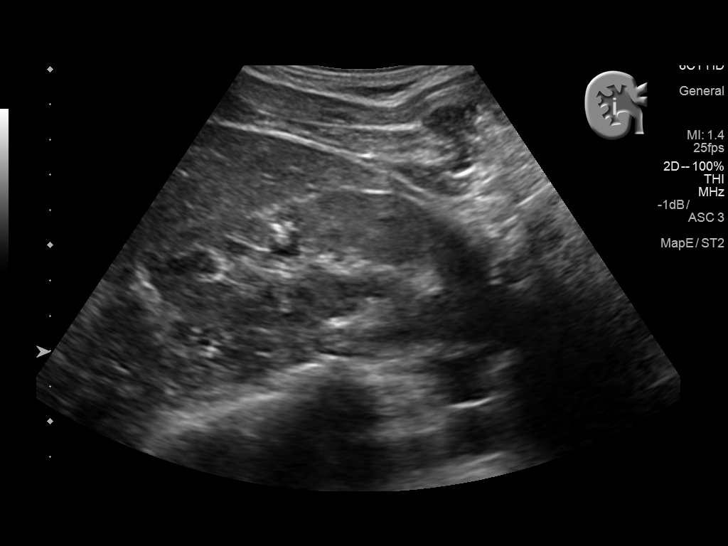
[im 85/114]
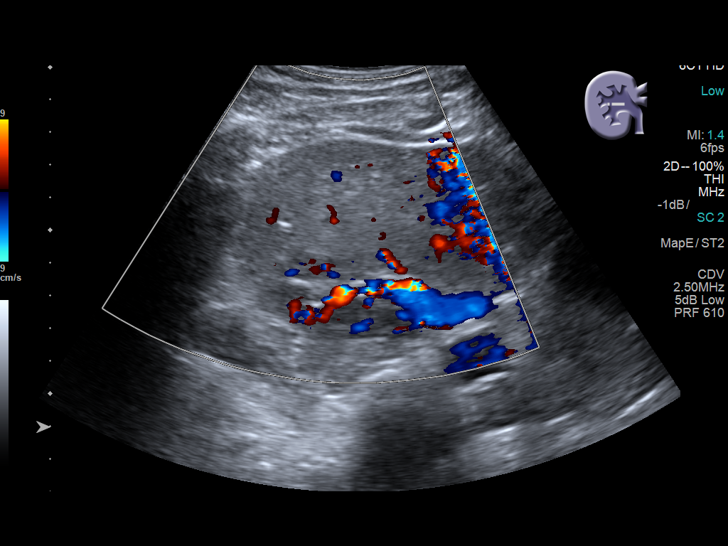
[im 95/114]
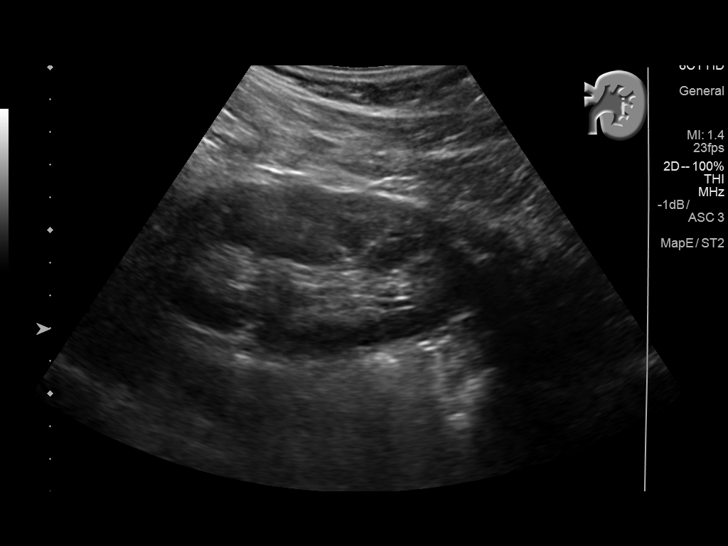
[im 104/114]
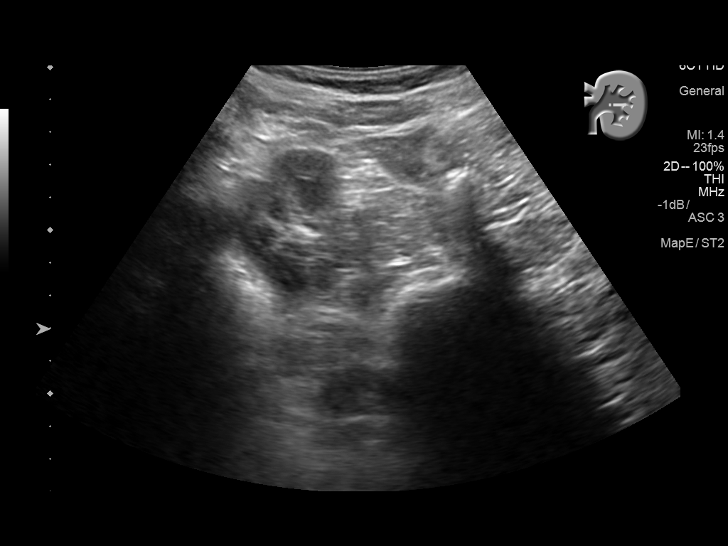
[im 114/114]
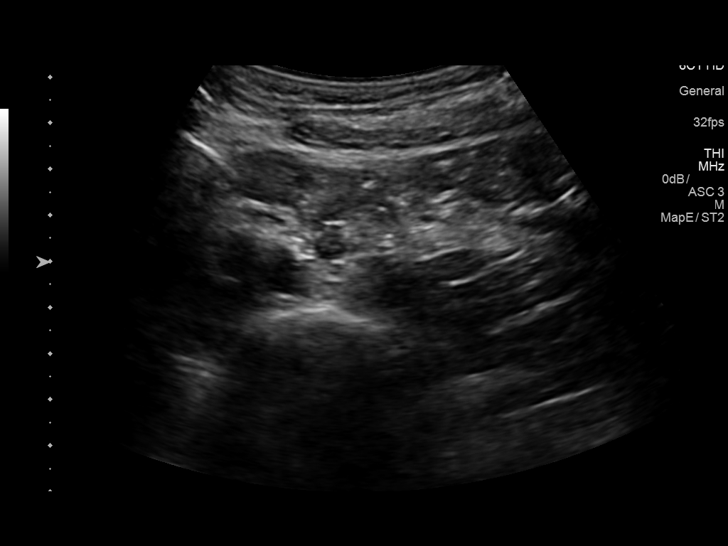

[14 of 25 positions shown; findings below may reference images not displayed]

FINDINGS: Gallbladder: No gallstones or wall thickening visualized. No
sonographic Murphy sign noted by sonographer.

Common bile duct: Diameter: 4.0 mm

Liver: No focal lesion identified. Within normal limits in
parenchymal echogenicity.

IVC: No abnormality visualized.

Pancreas: Visualized portion unremarkable.

Spleen: Size and appearance within normal limits.

Right Kidney: Length: 10.7 cm. Echogenicity within normal limits. No
mass or hydronephrosis visualized.

Left Kidney: Length: 10.2 cm. Echogenicity within normal limits. No
mass or hydronephrosis visualized.

Abdominal aorta: No aneurysm visualized.

Other findings: None.
IMPRESSION: No acute or focal abnormality.

## 2018-03-11 ENCOUNTER — Encounter: Payer: Self-pay | Admitting: *Deleted

## 2018-04-07 ENCOUNTER — Other Ambulatory Visit: Payer: Self-pay | Admitting: Psychiatry

## 2018-06-06 ENCOUNTER — Ambulatory Visit (INDEPENDENT_AMBULATORY_CARE_PROVIDER_SITE_OTHER): Payer: Self-pay

## 2018-06-06 ENCOUNTER — Ambulatory Visit (INDEPENDENT_AMBULATORY_CARE_PROVIDER_SITE_OTHER): Payer: Managed Care, Other (non HMO) | Admitting: Orthopedic Surgery

## 2018-06-06 ENCOUNTER — Ambulatory Visit (INDEPENDENT_AMBULATORY_CARE_PROVIDER_SITE_OTHER): Payer: Managed Care, Other (non HMO)

## 2018-06-06 ENCOUNTER — Encounter (INDEPENDENT_AMBULATORY_CARE_PROVIDER_SITE_OTHER): Payer: Self-pay | Admitting: Orthopedic Surgery

## 2018-06-06 VITALS — Ht 62.0 in | Wt 120.0 lb

## 2018-06-06 DIAGNOSIS — M25571 Pain in right ankle and joints of right foot: Secondary | ICD-10-CM

## 2018-06-06 DIAGNOSIS — M25572 Pain in left ankle and joints of left foot: Secondary | ICD-10-CM | POA: Diagnosis not present

## 2018-06-06 NOTE — Progress Notes (Addendum)
Office Visit Note   Patient: Lacey Murphy           Date of Birth: 1975-12-18           MRN: [id number] Visit Date: 06/06/2018              Requested by: Farris Has, MD [address] [place], [place] [address] PCP: Farris Has, MD  Chief Complaint  Patient presents with  . Left Ankle - Pain  . Right Ankle - Pain      HPI: Patient is a 42 year old woman who is active with [employer].  She states she has had chronic ankle symptoms for about 15 years she has had 2 previous MRI scans on her left ankle.  She states she has sprained her ankle over 25 times.  She states she sprained her right ankle about 20 times.  She complains of grinding within the ankle complains of some loose bodies over the medial malleolus of the left ankle.  She is used anti-inflammatories compression and ice.  Assessment & Plan: Visit Diagnoses:  1. Bilateral ankle pain, unspecified chronicity     Plan: The ankle was injected bilaterally she tolerated this well reevaluate in 4 weeks.  Discussed that if she achieves some temporary relief with the injection we could consider ankle arthroscopy for debridement of the impingement.  Recommended sole orthotics to support the arch  and decrease stress on the posterior tibial tendon on the left ankle.    Follow-Up Instructions: Return in about 4 weeks (around 07/04/2018).   Ortho Exam  Patient is alert, oriented, no adenopathy, well-dressed, normal affect, normal respiratory effort. Examination patient has palpable pulse bilaterally she has good ankle good subtalar motion anterior drawer is stable she does have some palpable loose bodies over the deltoid ligament medially of the left ankle.  She has some tenderness to palpation anteriorly over the ankle.  On the left ankle she has pain with a single limb heel raise with some posterior tibial tendinitis.  There is no palpable defect to the tendon and there is no palpable swelling.  Imaging: Xr Ankle 2  Views Left  Result Date: 06/06/2018 2 view radiographs of the left ankle shows some mild varus alignment of the tibial talar joint osteophytic bone spurs medially along the deltoid.  Lateral radiograph shows some mild osteophytic bone spurring of the tibial talar joint.  Xr Ankle 2 Views Right  Result Date: 06/06/2018 2 view radiographs of the right ankle show some varus alignment to the tibial talar joint with joint space narrowing medially.  There are some small loose bodies around the distal fibula.  Lateral radiograph shows some mild osteophytic bone spurring.  No images are attached to the encounter.  Labs: Lab Results  Component Value Date   ESRSEDRATE 1 11/01/2016     Lab Results  Component Value Date   ALBUMIN 4.4 11/01/2016   ALBUMIN 4.6 03/05/2014   ALBUMIN 4.6 12/06/2012    Body mass index is 21.95 kg/m.  Orders:  Orders Placed This Encounter  Procedures  . Medium Joint Inj  . XR Ankle 2 Views Right  . XR Ankle 2 Views Left   No orders of the defined types were placed in this encounter.    Procedures: Medium Joint Inj: bilateral ankle on 06/06/2018 4:52 PM Indications: pain and diagnostic evaluation Details: 22 G 1.5 in needle, anteromedial approach Outcome: tolerated well, no immediate complications Procedure, treatment alternatives, risks and benefits explained, specific risks discussed. Consent  was given by the patient. Immediately prior to procedure a time out was called to verify the correct patient, procedure, equipment, support staff and site/side marked as required. Patient was prepped and draped in the usual sterile fashion.      Clinical Data: No additional findings.  ROS:  All other systems negative, except as noted in the HPI. Review of Systems  Objective: Vital Signs: Ht 5\' 2"  (1.575 m)   Wt 120 lb (54.4 kg)   BMI 21.95 kg/m   Specialty Comments:  No specialty comments available.  PMFS History: Patient Active Problem List    Diagnosis Date Noted  . Elevated CK 03/25/2017  . Primary osteoarthritis of both hands 12/08/2016  . Primary osteoarthritis of both feet 12/08/2016  . DJD (degenerative joint disease), cervical 12/08/2016  . Family history of discoid lupus 11/01/2016  . Psoriasis 10/27/2016  . History of renal insufficiency/mild Dr Allena KatzPatel  10/27/2016  . Primary insomnia 10/27/2016  . ANA positive 10/27/2016  . Other fatigue 10/27/2016  . Bilateral ankle pain 05/25/2016  . Low back pain 08/27/2014  . ALT (SGPT) level raised 12/07/2012  . Palpitations 12/06/2012  . Renal calculus, left 12/06/2012  . Right hip pain 10/11/2012  . Right knee pain 10/11/2012  . Gastritis 04/24/2012  . Right shoulder pain 04/24/2012  . Epigastric abdominal pain 12/19/2011  . Dysthymia 12/19/2011  . Family history of colon cancer 09/02/2011  . Family history of breast cancer 09/02/2011  . Depression   . Migraine   . Hypothyroidism   . Hammertoe 06/29/2011   Past Medical History:  Diagnosis Date  . Arthritis   . Autoimmune thyroiditis   . Chronic kidney disease   . Depression   . Elevated AST (SGOT)   . Hallux abducto valgus 06/2011   right  . Hammertoe 06/2011   right 4th  . Hypothyroidism   . Kidney stones   . 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 Migraine   . Psoriasis     Family History  Problem Relation Age of Onset  . Breast cancer Mother   . Hyperlipidemia Mother   . Colon polyps Mother   . Heart failure Father   . Prostate cancer Father   . Diabetes Father   . Depression Sister   . Depression Brother   . Colon cancer Maternal Grandmother   . Colon polyps Maternal Grandmother   . Hypertension Paternal Grandmother   . Heart failure Paternal Grandfather   . Heart attack Neg Hx     Past Surgical History:  Procedure Laterality Date  . BREAST ENHANCEMENT SURGERY  1/13  . BUNIONECTOMY  1/13   right  . FOOT SURGERY     bilat.   Social History   Occupational History  . Not on file    Tobacco Use  . Smoking status: Never Smoker  . Smokeless tobacco: Never Used  Substance and Sexual Activity  . Alcohol use: Yes    Alcohol/week: 7.0 standard drinks    Types: 7 Glasses of wine per week    Comment: socially  . Drug use: No  . Sexual activity: Yes    Birth control/protection: None

## 2018-06-14 ENCOUNTER — Ambulatory Visit (INDEPENDENT_AMBULATORY_CARE_PROVIDER_SITE_OTHER): Payer: Managed Care, Other (non HMO) | Admitting: Orthopedic Surgery

## 2018-07-05 ENCOUNTER — Ambulatory Visit (INDEPENDENT_AMBULATORY_CARE_PROVIDER_SITE_OTHER): Payer: Managed Care, Other (non HMO) | Admitting: Orthopedic Surgery

## 2018-07-06 ENCOUNTER — Ambulatory Visit (INDEPENDENT_AMBULATORY_CARE_PROVIDER_SITE_OTHER): Payer: Managed Care, Other (non HMO) | Admitting: Physician Assistant

## 2018-07-06 ENCOUNTER — Encounter (INDEPENDENT_AMBULATORY_CARE_PROVIDER_SITE_OTHER): Payer: Self-pay | Admitting: Orthopedic Surgery

## 2018-07-06 VITALS — Ht 62.0 in | Wt 120.0 lb

## 2018-07-06 DIAGNOSIS — M25871 Other specified joint disorders, right ankle and foot: Secondary | ICD-10-CM

## 2018-07-06 DIAGNOSIS — M25872 Other specified joint disorders, left ankle and foot: Secondary | ICD-10-CM

## 2018-07-07 ENCOUNTER — Encounter (INDEPENDENT_AMBULATORY_CARE_PROVIDER_SITE_OTHER): Payer: Self-pay | Admitting: Physician Assistant

## 2018-07-07 NOTE — Progress Notes (Signed)
Office Visit Note   Patient: Lacey Murphy           Date of Birth: 09-11-1975           MRN: [id number] Visit Date: 07/06/2018              Requested by: Farris Has, MD [address] [place], [place] [address] PCP: Farris Has, MD  Chief Complaint  Patient presents with  . Right Ankle - Follow-up    S/p bilateral ankle injections 1210/19  . Left Ankle - Follow-up      HPI: The patient is a 43 year old woman who is seen for follow-up of bilateral ankle pain.  The left side has been more symptomatic than the right.  She underwent previous bilateral ankle steroid injections which did help with the pain but did not fully relieve the pain.  She has symptoms worse on the left side with indications of medial joint line tenderness.  She does have palpable loose bodies over the left deltoid ligament.  She reports that she has been in her last [employer] tournament and is now retiring from [employer] and would like to have something definitive done to improve the ankles.  Assessment & Plan: Visit Diagnoses:  1. 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  The procedure possible risks and complications and period of recovery were discussed with the patient.  She desires to proceed and surgery will be arranged.  Follow-Up Instructions: Return in about 3 weeks (around 07/27/2018).   Ortho Exam  Patient is alert, oriented, no adenopathy, well-dressed, normal affect, normal respiratory effort. Patient has tenderness over the left medial joint line and palpable loose bodies in the deltoid ligament over the ankle.  There is no instability with varus valgus stress.  There is no edema.  She has good pedal pulses but the toes are somewhat cool comparatively but have good capillary refill.  Imaging: No results found. No images are attached to the  encounter.  Labs: Lab Results  Component Value Date   ESRSEDRATE 1 11/01/2016     Lab Results  Component Value Date   ALBUMIN 4.4 11/01/2016   ALBUMIN 4.6 03/05/2014   ALBUMIN 4.6 12/06/2012    Body mass index is 21.95 kg/m.  Orders:  No orders of the defined types were placed in this encounter.  No orders of the defined types were placed in this encounter.    Procedures: No procedures performed  Clinical Data: No additional findings.  ROS:  All other systems negative, except as noted in the HPI. Review of Systems  Objective: Vital Signs: Ht 5\' 2"  (1.575 m)   Wt 120 lb (54.4 kg)   BMI 21.95 kg/m   Specialty Comments:  No specialty comments available.  PMFS History: Patient Active Problem List   Diagnosis Date Noted  . Elevated CK 03/25/2017  . Primary osteoarthritis of both hands 12/08/2016  . Primary osteoarthritis of both feet 12/08/2016  . DJD (degenerative joint disease), cervical 12/08/2016  . Family history of discoid lupus 11/01/2016  . Psoriasis 10/27/2016  . History of renal insufficiency/mild Dr Allena Katz  10/27/2016  . Primary insomnia 10/27/2016  . ANA positive 10/27/2016  . Other fatigue 10/27/2016  . Bilateral ankle pain 05/25/2016  . Low back pain 08/27/2014  . ALT (SGPT) level raised 12/07/2012  . Palpitations 12/06/2012  . Renal calculus, left 12/06/2012  .  Right hip pain 10/11/2012  . Right knee pain 10/11/2012  . Gastritis 04/24/2012  . Right shoulder pain 04/24/2012  . Epigastric abdominal pain 12/19/2011  . Dysthymia 12/19/2011  . Family history of colon cancer 09/02/2011  . Family history of breast cancer 09/02/2011  . Depression   . Migraine   . Hypothyroidism   . Hammertoe 06/29/2011   Past Medical History:  Diagnosis Date  . Arthritis   . Autoimmune thyroiditis   . Chronic kidney disease   . Depression   . Elevated AST (SGOT)   . Hallux abducto valgus 06/2011   right  . Hammertoe 06/2011   right 4th  .  Hypothyroidism   . Kidney stones   . 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 Migraine   . Psoriasis     Family History  Problem Relation Age of Onset  . Breast cancer Mother   . Hyperlipidemia Mother   . Colon polyps Mother   . Heart failure Father   . Prostate cancer Father   . Diabetes Father   . Depression Sister   . Depression Brother   . Colon cancer Maternal Grandmother   . Colon polyps Maternal Grandmother   . Hypertension Paternal Grandmother   . Heart failure Paternal Grandfather   . Heart attack Neg Hx     Past Surgical History:  Procedure Laterality Date  . BREAST ENHANCEMENT SURGERY  1/13  . BUNIONECTOMY  1/13   right  . FOOT SURGERY     bilat.   Social History   Occupational History  . Not on file  Tobacco Use  . Smoking status: Never Smoker  . Smokeless tobacco: Never Used  Substance and Sexual Activity  . Alcohol use: Yes    Alcohol/week: 7.0 standard drinks    Types: 7 Glasses of wine per week    Comment: socially  . Drug use: No  . Sexual activity: Yes    Birth control/protection: None

## 2019-09-24 ENCOUNTER — Encounter (INDEPENDENT_AMBULATORY_CARE_PROVIDER_SITE_OTHER): Payer: Self-pay

## 2022-01-24 ENCOUNTER — Emergency Department (HOSPITAL_COMMUNITY)
Admission: EM | Admit: 2022-01-24 | Discharge: 2022-01-24 | Disposition: A | Discharge status: Home / Self Care | Payer: BC Managed Care – PPO | Attending: Emergency Medicine | Admitting: Emergency Medicine

## 2022-01-24 DIAGNOSIS — X58XXXA Exposure to other specified factors, initial encounter: Secondary | ICD-10-CM | POA: Insufficient documentation

## 2022-01-24 DIAGNOSIS — T1591XA Foreign body on external eye, part unspecified, right eye, initial encounter: Secondary | ICD-10-CM | POA: Diagnosis present

## 2022-01-24 MED ORDER — OFLOXACIN 0.3 % OP SOLN
1.0000 [drp] | Freq: Four times a day (QID) | OPHTHALMIC | Status: DC
  Administered 2022-01-24: 1 [drp] via OPHTHALMIC
  Filled 2022-01-24: qty 5

## 2022-01-24 MED ORDER — OFLOXACIN 0.3 % OP SOLN: 1.0000 [drp] | Freq: Four times a day (QID) | OPHTHALMIC | 0 refills | Status: AC

## 2022-01-24 NOTE — Discharge Instructions (Signed)
Follow up with Dr. Dione Booze on Tuesday at 8 AM

## 2022-01-24 NOTE — Consult Note (Signed)
Ophthalmology Initial Consult Note  Lacey, Murphy, 46 y.o. female Date of Service:  01/24/22  Requesting physician: ER Physician   Information Obtained from: Patient  Chief Complaint:  Rt eye pain  HPI/Discussion:  Lacey Murphy is a 46 y.o. female who presents to ED w Hx thorn injury to OD. Mild pain, FBS, tearing. Vision a little blurry d/t tearing. No flashes or floaters. No pus or significant drainage other than watery eye.  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    Prior to Admission Meds:  Inpatient Meds:   No Known Allergies Social History   Tobacco Use   Smoking status: Never   Smokeless tobacco: Never  Substance Use Topics   Alcohol use: Yes    Alcohol/week: 7.0 standard drinks of alcohol    Types: 7 Glasses of wine per week    Comment: socially   Family History  Problem Relation Age of Onset   Breast cancer Mother    Hyperlipidemia Mother    Colon polyps Mother    Heart failure Father    Prostate cancer Father    Diabetes Father    Depression Sister    Depression Brother    Colon cancer Maternal Grandmother    Colon polyps Maternal Grandmother    Hypertension Paternal Grandmother    Heart failure Paternal Grandfather    Heart attack Neg Hx     ROS: Other than ROS in the HPI, all other systems were negative.  Exam: Temp: 99.3 F (37.4 C) Pulse Rate: 68 BP: 111/76 Resp: 18 SpO2: 98 %  Visual Acuity:  [place]  cc  ph  near   OD  +     20/30   OS   +     20/30        OD OS  Confr Vis Fields Full Full  EOM (Primary) Full Full  Lids/Lashes WNL WNL  Conjunctiva - Bulbar White White  Conjunctiva - Palpebral               WNL WNL  Adnexa  WNL WNL  Pupils  RR RR  Reaction, Direct Brisk Brisk                 Consensual Brisk Brisk                 RAPD No No  Cornea  Embedded FB at temporal limbus superficially; Seidel negative Clear  Anterior Chamber Deep Deep  Lens:  Clear Clear  IOP 14 Physiologic  Fundus - Dilated? No                                        Neuro:  Oriented to person, place, and time:  Yes Psychiatric:  Mood and Affect Appropriate:  Yes  Labs/imaging:   A/P:  46 y.o. female with imbedded CFB OD - FB removed at bedside - Seidel negative - Rec Ofloxacin QID - Will f/u in office and perform DFE at that time  Marchelle Gearing, MD 01/24/2022, 10:48 PM

## 2022-01-24 NOTE — ED Triage Notes (Signed)
Pt c/o "rose thorn" in R eye approx 2hrs ago. Seen at [facility] for same, sent to ED for further eval, available resources.

## 2022-01-24 NOTE — ED Provider Notes (Signed)
  [facility] EMERGENCY DEPARTMENT Provider Note   CSN: [id number] Arrival date & time: 01/24/22  1737     History {Add pertinent medical, surgical, social history, OB history to HPI:1} Chief Complaint  Patient presents with   Foreign Body in Eye    R eye    Lacey Murphy is a 46 y.o. female.  HPI     Home Medications Prior to Admission medications   Medication Sig Start Date End Date Taking? Authorizing Provider  ofloxacin (OCUFLOX) 0.3 % ophthalmic solution Place 1 drop into the right eye 4 (four) times daily. 01/24/22  Yes Henderly, Britni A, PA-C  buPROPion (WELLBUTRIN XL) 150 MG 24 hr tablet TAKE 1 TABLET DAILY 04/10/18   Shugart, Mat Carne, PA-C  Cetirizine HCl (ZYRTEC ALLERGY PO) Take by mouth daily.    [provider]  MARLISSA 0.15-30 MG-MCG tablet Take 1 tablet by mouth daily.  09/30/16   [provider]  meloxicam (MOBIC) 15 MG tablet Take one tablet by mouth daily with food 09/02/14   Hudnall, Azucena Fallen, MD  Multiple Vitamins-Minerals (MULTI FOR HER PO) Take 1 tablet by mouth daily.    [provider]  SYNTHROID 75 MCG tablet Take 75 mcg by mouth daily.  09/23/16   [provider]  TRINTELLIX 10 MG TABS Take 1 tablet by mouth daily. 01/14/17   [provider]      Allergies    Patient has no known allergies.    Review of Systems   Review of Systems  Physical Exam Updated Vital Signs BP 108/74   Pulse 74   Temp 98.2 F (36.8 C) (Oral)   Resp 16   SpO2 99%  Physical Exam  ED Results / Procedures / Treatments   Labs (all labs ordered are listed, but only abnormal results are displayed) Labs Reviewed - No data to display  EKG None  Radiology No results found.  Procedures Procedures  {Document cardiac monitor, telemetry assessment procedure when appropriate:1}  Medications Ordered in ED Medications  ofloxacin (OCUFLOX) 0.3 % ophthalmic solution 1 drop (has no administration in time range)    ED  Course/ Medical Decision Making/ A&P                           Medical Decision Making  ***  {Document critical care time when appropriate:1} {Document review of labs and clinical decision tools ie heart score, Chads2Vasc2 etc:1}  {Document your independent review of radiology images, and any outside records:1} {Document your discussion with family members, caretakers, and with consultants:1} {Document social determinants of health affecting pt's care:1} {Document your decision making why or why not admission, treatments were needed:1} Final Clinical Impression(s) / ED Diagnoses Final diagnoses:  Foreign body of right eye, initial encounter    Rx / DC Orders ED Discharge Orders          Ordered    ofloxacin (OCUFLOX) 0.3 % ophthalmic solution  4 times daily        01/24/22 2113

## 2022-08-23 NOTE — Progress Notes (Signed)
     Chief Complaint  Patient presents with  . Follow-up    Subjective   HPI  Lacey Murphy is a 47 y.o. female presents for follow up of chronic conditions.  ADHD She is taking Vyvanse. She has been frustrated about prior authorization process with medication. She wanted 90 day fill which cannot be done as is schedule 2 drug. She also wanted prescription to say dispense as written which makes it generic although she wants name brand only for her insurance coverage. There is also Engineer, water which she is further frustrated about as it makes it difficult to get the medication.   Patient denies fever, chills, chest pain, shortness of breath, edema, abdominal pain, nausea, vomiting, diarrhea, urinary symptoms, and joint pains.   PMH including allergies, medications, and family and social history reviewed by me and updated in Epic at time of visit today.   ROS A complete ROS was performed with positive and negative pertinent findings listed in the HPI. All other systems are negative.   Objective   Physical Exam  Vitals:   08/23/22 1550  BP: 122/80  Pulse: 93  Resp: 16  SpO2: 99%  Height: 1.562 m (5' 1.5)  Weight: 54.2 kg (119 lb 6.4 oz)  BMI (Calculated): 22.2    General: Well appearing, well developed, well nourished, no acute distress  Head: Normocephalic, atraumatic Ears: Hearing intact to normal voice. Neurologic: Alert and oriented x3, DTRs symmetric Skin: No rashes, no evidence of skin breakdown, no suspicious nevi observed of exposed skin  No orders of the defined types were placed in this encounter.   Results for orders placed or performed in visit on 07/15/22  Urine Culture   Specimen: Clean Catch; Urine  Result Value Ref Range   Urine culture ID < 10,000 col/ml No further workup     Assessment & Plan   1. Attention deficit disorder (ADD) without hyperactivity        Advised patient that if she does not want to be seen every 6 months  and wants 90 day prescription she will need to be seen elsewhere as this would violate [employer] Stop Act rules Cannot do anything about nationwide shortage which is very frustrating for her She does not need refill at this time Did advise she see if her pharmacy has ample 10 mg capsules and can try to send 10 mg capsules 3 for 30 mg daily She must be seen in 6 months for follow up, or find another provider for this   Instructions and side effects for new medications were discussed. All questions answered at today's visit.    Patient instructed to return to clinic sooner or seek urgent or emergent care for problems with medications or new or worsening symptoms including but not limited to fever, chills, nausea, vomiting, diarrhea, chest pain and shortness of breath.   Electronically signed by:  Lum Vivian Dross, PA-C, 08/23/2022 3:11 PM  There are no Patient Instructions on file for this visit.    Electronically signed by: Dross Lum Vivian, PA-C 08/23/22 1626
